# Patient Record
Sex: Male | Born: 1960 | Race: White | Hispanic: No | Marital: Single | Smoking: Current every day smoker
Health system: Southern US, Community
[De-identification: ages and names within clinical notes are randomized; demographics above are authoritative.]

## PROBLEM LIST (undated history)

## (undated) DIAGNOSIS — M1990 Unspecified osteoarthritis, unspecified site: Secondary | ICD-10-CM

## (undated) DIAGNOSIS — Z8619 Personal history of other infectious and parasitic diseases: Secondary | ICD-10-CM

## (undated) DIAGNOSIS — I10 Essential (primary) hypertension: Principal | ICD-10-CM

## (undated) DIAGNOSIS — Z87898 Personal history of other specified conditions: Secondary | ICD-10-CM

## (undated) DIAGNOSIS — A630 Anogenital (venereal) warts: Secondary | ICD-10-CM

## (undated) HISTORY — PX: ANKLE FRACTURE SURGERY: SHX122

## (undated) HISTORY — DX: Personal history of other specified conditions: Z87.898

## (undated) HISTORY — DX: Anogenital (venereal) warts: A63.0

## (undated) HISTORY — PX: OTHER SURGICAL HISTORY: SHX169

## (undated) HISTORY — DX: Unspecified osteoarthritis, unspecified site: M19.90

## (undated) HISTORY — DX: Personal history of other infectious and parasitic diseases: Z86.19

## (undated) HISTORY — PX: SPINE SURGERY: SHX786

## (undated) HISTORY — DX: Essential (primary) hypertension: I10

---

## 2002-11-15 ENCOUNTER — Encounter: Payer: Self-pay | Admitting: Orthopaedic Surgery

## 2002-11-15 ENCOUNTER — Inpatient Hospital Stay (HOSPITAL_COMMUNITY): Admission: EM | Admit: 2002-11-15 | Discharge: 2002-11-17 | Payer: Self-pay | Admitting: Emergency Medicine

## 2002-11-15 ENCOUNTER — Encounter: Payer: Self-pay | Admitting: Emergency Medicine

## 2004-03-16 ENCOUNTER — Inpatient Hospital Stay (HOSPITAL_BASED_OUTPATIENT_CLINIC_OR_DEPARTMENT_OTHER): Admission: RE | Admit: 2004-03-16 | Discharge: 2004-03-16 | Payer: Self-pay | Admitting: Cardiovascular Disease

## 2007-07-27 ENCOUNTER — Emergency Department (HOSPITAL_COMMUNITY): Admission: EM | Admit: 2007-07-27 | Discharge: 2007-07-27 | Payer: Self-pay | Admitting: Family Medicine

## 2010-03-30 ENCOUNTER — Ambulatory Visit: Payer: Self-pay | Admitting: Cardiovascular Disease

## 2010-12-16 NOTE — Op Note (Signed)
NAME:  Edward Bishop, Edward Bishop                     ACCOUNT NO.:  [id number]   MEDICAL RECORD NO.:  [id number]                   PATIENT TYPE:  EMS   LOCATION:  MINO                                 FACILITY:   PHYSICIAN:  Claude Manges. Cleophas Dunker, M.D.            DATE OF BIRTH:  [birth year]   DATE OF PROCEDURE:  11/15/2002  DATE OF DISCHARGE:                                 OPERATIVE REPORT   PREOPERATIVE DIAGNOSIS:  Displaced bimalleolar portion of trimalleolar  fracture, left ankle.   POSTOPERATIVE DIAGNOSIS:  Displaced bimalleolar portion of trimalleolar  fracture, left ankle.   OPERATION/PROCEDURE:  Open reduction/internal fixation, bimalleolar  fracture, left ankle.   SURGEON:  Claude Manges. Cleophas Dunker, M.D.   ASSISTANT:  Legrand Pitts. Duffy, P.A.   ANESTHESIA:  General orotracheal.   COMPLICATIONS:  None.   PROCEDURE:  With the patient comfortable on the operating table and under  general orotracheal anesthesia, a bump is placed beneath the left hip to  facilitate exposure of the lateral left ankle.  The left ankle was prepped  with Betadine scrub and then DuraPrep from the tips of the toes to the  knees.  Sterile draping was performed.  With the extremity still elevated,  it was Esmarch exsanguinated with a proximal tourniquet to 250 mmHg.  The  fracture involved the medial malleolus and the lateral malleolus at the  level of the plafond and the joint.  There was diastasis.  There was a very  small posterior tibial lip fracture that was nondisplaced and therefore did  not require internal fixation.   Initial procedure was performed medially.  An oblique incision was made over  the medial malleolus via blunt dissection.  The soft tissue was then  elevated off the fracture site.  Hematoma was evacuated.  Under direct  visualization the fracture was reduced. There was invagination of periosteum  and this was just elevated so that we could reduce the fracture and was  maintained with a  K-wire.  Drill hole was then placed in the tip of the  medial malleolus into the more proximal fragment and a single 55-mm fully  threaded cancellous screw was then inserted with excellent fixation.  It was  nice and tight and no motion whatsoever at the fracture site.  A K-wire was  reduced and again with a range of motion the ankle was not unstable.  The  wound was irrigated with saline solution, the periosteum closed anatomically  with 2-0 Vicryl, the subcu with 2-0 Vicryl and the skin closed with skin  clips.  We did check position with image intensification and felt the screw  was in excellent position.   A long lateral incision was then made over the lateral malleolus and via  sharp dissection carried into the subcutaneous tissue.  The fibula was  located above the peroneal tendons.  A small incision was then made as there  was no fracture rupture of the periosteum.  Then  this was peeled with a  Glorious Peach off the distal fibula.  The fibula was a complex fracture extending  over an area of about 3 inches.  Under direct visualization the fracture was  maintained with bone clamps.  A transfixion screw was placed from proximal  anterior to distal posterior with good fixation of the larger of the  fragments.  A 7-hole DePuy plate was then applied and secured with 3  proximal cortical screws maintaining close apposition of the plate to the  fibula and 2 screws were inserted distally.  The very distal screw was used  as a diastasis screw with the ankle in maximum dorsiflexion.  A drill hole  was then made across the fibula.  A 55 mm cortical screw was then inserted  and cinched with excellent reduction of the diastasis and good purchase of  bone.  We checked under image intensification and felt we had a very stable  construct.  Clinically it was stable.  Wound was then irrigated with saline  solution.  Periosteum and fascia closed with 2-0 Vicryl, subcu with the same  material.  Skin closed  with skin clips.  Sterile bulky dressing was applied  followed by posterior splint and coaptation splint with 5 x 30 plaster  splint.  The tourniquet was deflated with immediate capillary refill to the  toes.   The patient tolerated the procedure without complications.                                                Claude Manges. Cleophas Dunker, M.D.    PWW/MEDQ  D:  11/15/2002  T:  11/16/2002  Job:  [id number]

## 2010-12-16 NOTE — H&P (Signed)
NAME:  Edward Bishop, Edward Bishop                     ACCOUNT NO.:  [id number]   MEDICAL RECORD NO.:  [id number]                   PATIENT TYPE:  AMB   LOCATION:                                       FACILITY:  [facility]   PHYSICIAN:  Vesta Mixer, M.D.              DATE OF BIRTH:  1961/06/16   DATE OF ADMISSION:  03/16/2004  DATE OF DISCHARGE:                                HISTORY & PHYSICAL   Edward Bishop is a young gentleman with history of supraventricular tachycardia.  He has a long history of cigarette smoking.  We performed a stress  Cardiolite study for some evaluation of some episodes of chest pain and it  was found to be abnormal.  He was found to have attenuation of the anterior  leads with possibility of anterior lateral ischemia. Because of these  abnormalities, he is referred for heart catheterization.   The patient has had intermittent episodes of chest tightness and chest  fullness over the past several months.  These typically occur more with  exertion.  He has not had pain at rest.  He denies any syncope or  presyncope.  He denies any shortness of breath, cough, fever or sputum  production.   CURRENT MEDICATIONS:  1. Lanoxin 0.25 mg daily.  2. Atenolol 50 mg daily.   He is allergic to PENICILLIN.   PAST MEDICAL HISTORY:  History of supraventricular tachycardia.  He is well  controlled on Lanoxin and atenolol.   SOCIAL HISTORY:  The patient smokes approximately one pack a day.  He works  as a Freight forwarder for [employer].   His review of systems was reviewed and is essentially negative.   His family history is positive for coronary artery disease.   PHYSICAL EXAMINATION:  GENERAL:  He is a young gentleman in no acute  distress. He is alert and oriented x3 and his mood and affect are normal.  VITAL SIGNS:  His weight is 188, his blood pressure is 128/88 with heart  rate of 60.  HEENT:  Reveals 2+ carotids.  He has no bruits.  No jugular venous  distention.  No  thyromegaly.  LUNGS:  Clear to auscultation.  HEART:  Regular rate.  S1, S2 with no murmurs, gallops or rub.  ABDOMEN:  Reveals good bowel sounds and is nontender.  EXTREMITIES:  He has no clubbing, cyanosis or edema.  NEURO:  Nonfocal.   [name] presents with some episodes of chest pain.  He had a stress  Cardiolite study which suggests that he may have some anterior lateral  ischemia.  I have suggested that we proceed with heart catheterization.  We  have discussed the risks, benefits and options of heart catheterization. He  understands and agrees to proceed.  Vesta Mixer, M.D.    PJN/MEDQ  D:  03/02/2004  T:  03/02/2004  Job:  [id number]

## 2010-12-16 NOTE — Cardiovascular Report (Signed)
NAME:  Edward Bishop, Edward Bishop                     ACCOUNT NO.:  [id number]   MEDICAL RECORD NO.:  [id number]                   PATIENT TYPE:  OIB   LOCATION:  6501                                 FACILITY:  [facility]   PHYSICIAN:  Vesta Mixer, M.D.              DATE OF BIRTH:  20-Jul-1961   DATE OF PROCEDURE:  03/16/2004  DATE OF DISCHARGE:  03/16/2004                              CARDIAC CATHETERIZATION   PROCEDURE:  Left heart catheterization with coronary angiography.   The right femoral artery was easily cannulated using the modified Seldinger  technique.   HEMODYNAMICS:  The left ventricular pressure was 107/13 with an aortic  pressure of 106/69.   CORONARY ANGIOGRAPHY:  1. Left main coronary artery is relatively smooth and normal.  2. The left anterior descending artery is smooth and normal.  There are     several moderate size diagonal branches which are normal.  3. The left circumflex artery is smooth and normal.  There are several     marginal branches which are normal.  4. The right coronary artery is large and dominant.  It is smooth and normal     throughout its course.  It gives off a moderate size posterior descending     artery and then a moderate size posterior lateral branch.  These branches     are smooth and normal.   LEFT VENTRICULOGRAM:  Left ventriculogram was performed in a 30-RAO  position.  It reveals overall normal left ventricular systolic function.  The ejection fraction is approximately 55%.  There is no mitral  regurgitation.   COMPLICATIONS:  None.   CONCLUSIONS:  1. Smooth and normal coronary arteries.  2. Normal left ventricular systolic function.                                               Vesta Mixer, M.D.    PJN/MEDQ  D:  03/16/2004  T:  03/17/2004  Job:  [id number]

## 2011-02-09 ENCOUNTER — Other Ambulatory Visit: Payer: Self-pay | Admitting: Occupational Medicine

## 2011-02-09 ENCOUNTER — Ambulatory Visit: Payer: Self-pay

## 2011-02-09 DIAGNOSIS — M549 Dorsalgia, unspecified: Secondary | ICD-10-CM

## 2011-07-11 ENCOUNTER — Telehealth: Payer: Self-pay

## 2011-07-11 NOTE — Telephone Encounter (Signed)
Opp,H&P,Cath faxed to [facility]  @ [phone]  07/11/11/km

## 2011-11-13 ENCOUNTER — Encounter: Payer: Self-pay | Admitting: *Deleted

## 2011-11-13 ENCOUNTER — Ambulatory Visit (INDEPENDENT_AMBULATORY_CARE_PROVIDER_SITE_OTHER): Payer: Self-pay | Admitting: Cardiovascular Disease

## 2011-11-13 ENCOUNTER — Encounter: Payer: Self-pay | Admitting: Cardiovascular Disease

## 2011-11-13 VITALS — BP 130/87 | HR 72 | Ht 72.0 in | Wt 176.1 lb

## 2011-11-13 DIAGNOSIS — E785 Hyperlipidemia, unspecified: Secondary | ICD-10-CM

## 2011-11-13 DIAGNOSIS — R002 Palpitations: Secondary | ICD-10-CM

## 2011-11-13 DIAGNOSIS — I498 Other specified cardiac arrhythmias: Secondary | ICD-10-CM

## 2011-11-13 DIAGNOSIS — I471 Supraventricular tachycardia: Secondary | ICD-10-CM | POA: Insufficient documentation

## 2011-11-13 LAB — HEPATIC FUNCTION PANEL
ALT: 16 U/L (ref 0–53)
AST: 19 U/L (ref 0–37)
Albumin: 4.8 g/dL (ref 3.5–5.2)
Alkaline Phosphatase: 71 U/L (ref 39–117)
Bilirubin, Direct: 0.1 mg/dL (ref 0.0–0.3)
Total Bilirubin: 0.9 mg/dL (ref 0.3–1.2)
Total Protein: 7.6 g/dL (ref 6.0–8.3)

## 2011-11-13 LAB — BASIC METABOLIC PANEL
BUN: 11 mg/dL (ref 6–23)
CO2: 24 mEq/L (ref 19–32)
Calcium: 9.8 mg/dL (ref 8.4–10.5)
Chloride: 103 mEq/L (ref 96–112)
Creatinine, Ser: 1.1 mg/dL (ref 0.4–1.5)
GFR: 73.88 mL/min (ref >60.00)
Glucose, Bld: 93 mg/dL (ref 70–99)
Potassium: 4.6 mEq/L (ref 3.5–5.1)
Sodium: 139 mEq/L (ref 135–145)

## 2011-11-13 LAB — LIPID PANEL
Cholesterol: 191 mg/dL (ref 0–200)
HDL: 56.7 mg/dL (ref >39.00)
LDL Cholesterol: 106 mg/dL — ABNORMAL HIGH (ref 0–99)
Total CHOL/HDL Ratio: 3
Triglycerides: 140 mg/dL (ref 0.0–149.0)
VLDL: 28 mg/dL (ref 0.0–40.0)

## 2011-11-13 MED ORDER — PROPRANOLOL HCL 10 MG PO TABS: 10.0000 mg | ORAL_TABLET | Freq: Four times a day (QID) | ORAL | Status: DC | PRN

## 2011-11-13 NOTE — Patient Instructions (Signed)
Your physician wants you to follow-up in: 1 year You will receive a reminder letter in the mail two months in advance. If you don't receive a letter, please call our office to schedule the follow-up appointment.  Your physician recommends that you return for a FASTING lipid profile: today and in a year. 

## 2011-11-13 NOTE — Assessment & Plan Note (Signed)
Lincon has a history of supraventricular tachycardia. He's been very stable the past several years and has not had to go to the emergency room in quite a long time. He has been off his daily beta blocker without any problems.  We will call in propanolol that he can take on an as-needed basis.  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 Because he's been quite stable.  it is my opinion that he does not need to have a DOT physical on an annual basis. I think that he can revert back to the every other year schedule that is more typical.
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
## 2011-11-13 NOTE — Assessment & Plan Note (Signed)
He has a history of hyperlipidemia the past. We'll check fasting laboratory.

## 2013-05-05 ENCOUNTER — Ambulatory Visit (INDEPENDENT_AMBULATORY_CARE_PROVIDER_SITE_OTHER): Payer: BC Managed Care – PPO | Admitting: Internal Medicine

## 2013-05-05 ENCOUNTER — Encounter: Payer: Self-pay | Admitting: Internal Medicine

## 2013-05-05 ENCOUNTER — Other Ambulatory Visit (INDEPENDENT_AMBULATORY_CARE_PROVIDER_SITE_OTHER): Payer: BC Managed Care – PPO

## 2013-05-05 VITALS — BP 118/86 | HR 82 | Temp 98.3°F | Resp 16 | Ht 72.0 in | Wt 198.0 lb

## 2013-05-05 DIAGNOSIS — I498 Other specified cardiac arrhythmias: Secondary | ICD-10-CM

## 2013-05-05 DIAGNOSIS — A630 Anogenital (venereal) warts: Secondary | ICD-10-CM

## 2013-05-05 DIAGNOSIS — Z0000 Encounter for general adult medical examination without abnormal findings: Secondary | ICD-10-CM

## 2013-05-05 DIAGNOSIS — F17200 Nicotine dependence, unspecified, uncomplicated: Secondary | ICD-10-CM

## 2013-05-05 DIAGNOSIS — I471 Supraventricular tachycardia: Secondary | ICD-10-CM

## 2013-05-05 DIAGNOSIS — Z720 Tobacco use: Secondary | ICD-10-CM

## 2013-05-05 DIAGNOSIS — E785 Hyperlipidemia, unspecified: Secondary | ICD-10-CM

## 2013-05-05 DIAGNOSIS — Z23 Encounter for immunization: Secondary | ICD-10-CM

## 2013-05-05 LAB — CBC WITH DIFFERENTIAL/PLATELET
Basophils Absolute: 0 10*3/uL (ref 0.0–0.1)
Basophils Relative: 0.3 % (ref 0.0–3.0)
Eosinophils Absolute: 0.2 10*3/uL (ref 0.0–0.7)
Eosinophils Relative: 1.6 % (ref 0.0–5.0)
HCT: 45.8 % (ref 39.0–52.0)
Hemoglobin: 15.6 g/dL (ref 13.0–17.0)
Lymphocytes Relative: 16.8 % (ref 12.0–46.0)
Lymphs Abs: 2.2 10*3/uL (ref 0.7–4.0)
MCHC: 34.2 g/dL (ref 30.0–36.0)
MCV: 96.5 fl (ref 78.0–100.0)
Monocytes Absolute: 0.7 10*3/uL (ref 0.1–1.0)
Monocytes Relative: 5.7 % (ref 3.0–12.0)
Neutro Abs: 9.7 10*3/uL — ABNORMAL HIGH (ref 1.4–7.7)
Neutrophils Relative %: 75.6 % (ref 43.0–77.0)
Platelets: 204 10*3/uL (ref 150.0–400.0)
RBC: 4.75 Mil/uL (ref 4.22–5.81)
RDW: 13.3 % (ref 11.5–14.6)
WBC: 12.9 10*3/uL — ABNORMAL HIGH (ref 4.5–10.5)

## 2013-05-05 LAB — COMPREHENSIVE METABOLIC PANEL
ALT: 25 U/L (ref 0–53)
AST: 22 U/L (ref 0–37)
Albumin: 4.3 g/dL (ref 3.5–5.2)
Alkaline Phosphatase: 64 U/L (ref 39–117)
BUN: 8 mg/dL (ref 6–23)
CO2: 25 mEq/L (ref 19–32)
Calcium: 9.2 mg/dL (ref 8.4–10.5)
Chloride: 104 mEq/L (ref 96–112)
Creatinine, Ser: 1.1 mg/dL (ref 0.4–1.5)
GFR: 79.12 mL/min (ref >60.00)
Glucose, Bld: 99 mg/dL (ref 70–99)
Potassium: 3.8 mEq/L (ref 3.5–5.1)
Sodium: 136 mEq/L (ref 135–145)
Total Bilirubin: 0.8 mg/dL (ref 0.3–1.2)
Total Protein: 6.9 g/dL (ref 6.0–8.3)

## 2013-05-05 LAB — URINALYSIS, ROUTINE W REFLEX MICROSCOPIC
Bilirubin Urine: NEGATIVE
Hgb urine dipstick: NEGATIVE
Ketones, ur: NEGATIVE
Nitrite: NEGATIVE
RBC / HPF: NONE SEEN (ref 0–?)
Specific Gravity, Urine: 1.01 (ref 1.000–1.030)
Total Protein, Urine: NEGATIVE
Urine Glucose: NEGATIVE
Urobilinogen, UA: 0.2 (ref 0.0–1.0)
pH: 6 (ref 5.0–8.0)

## 2013-05-05 LAB — LIPID PANEL
Cholesterol: 161 mg/dL (ref 0–200)
HDL: 47.7 mg/dL (ref >39.00)
LDL Cholesterol: 94 mg/dL (ref 0–99)
Total CHOL/HDL Ratio: 3
Triglycerides: 97 mg/dL (ref 0.0–149.0)
VLDL: 19.4 mg/dL (ref 0.0–40.0)

## 2013-05-05 LAB — HIV ANTIBODY (ROUTINE TESTING W REFLEX): HIV: NONREACTIVE

## 2013-05-05 LAB — PSA: PSA: 0.56 ng/mL (ref 0.10–4.00)

## 2013-05-05 LAB — HEPATITIS C ANTIBODY: HCV Ab: NEGATIVE

## 2013-05-05 LAB — RPR

## 2013-05-05 LAB — TSH: TSH: 0.88 u[IU]/mL (ref 0.35–5.50)

## 2013-05-05 MED ORDER — IMIQUIMOD 5 % EX CREA: TOPICAL_CREAM | CUTANEOUS | Status: DC

## 2013-05-05 NOTE — Progress Notes (Signed)
Subjective:    Patient ID: Edward Bishop, male    DOB: 07/28/1961, 52 y.o.   MRN: [id number]  HPI  New to me for a complete physical but he also complains of bumps at the base of his penis, he has a history or genital warts. He wants to be treated for warts and to be tested for STD's as well.  Review of Systems  Constitutional: Negative.  Negative for fever, chills, diaphoresis, appetite change and fatigue.  HENT: Negative.   Eyes: Negative.   Respiratory: Negative.  Negative for cough, chest tightness, shortness of breath, wheezing and stridor.   Cardiovascular: Negative.  Negative for chest pain, palpitations and leg swelling.  Gastrointestinal: Negative.  Negative for nausea, vomiting, abdominal pain, diarrhea and constipation.  Endocrine: Negative.   Genitourinary: Negative.  Negative for dysuria, urgency, frequency, hematuria, flank pain, decreased urine volume, discharge, penile swelling, scrotal swelling, enuresis, difficulty urinating, genital sores, penile pain and testicular pain.  Musculoskeletal: Negative.   Skin: Positive for rash. Negative for color change and pallor.  Allergic/Immunologic: Negative.   Neurological: Negative.   Hematological: Negative.  Negative for adenopathy. Does not bruise/bleed easily.  Psychiatric/Behavioral: Negative.        Objective:   Physical Exam  Vitals reviewed. Constitutional: He is oriented to person, place, and time. He appears well-developed and well-nourished. No distress.  HENT:  Head: Normocephalic and atraumatic.  Mouth/Throat: Oropharynx is clear and moist. No oropharyngeal exudate.  Eyes: Conjunctivae are normal. Right eye exhibits no discharge. Left eye exhibits no discharge. No scleral icterus.  Neck: Normal range of motion. Neck supple. No JVD present. No tracheal deviation present. No thyromegaly present.  Cardiovascular: Normal rate, regular rhythm, normal heart sounds and intact distal pulses.  Exam reveals no gallop  and no friction rub.   No murmur heard. Pulmonary/Chest: Effort normal and breath sounds normal. No stridor. No respiratory distress. He has no wheezes. He has no rales. He exhibits no tenderness.  Abdominal: Soft. Bowel sounds are normal. He exhibits no distension and no mass. There is no tenderness. There is no rebound and no guarding. Hernia confirmed negative in the right inguinal area and confirmed negative in the left inguinal area.  Genitourinary: Rectum normal, prostate normal, testes normal and penis normal. Rectal exam shows no external hemorrhoid, no internal hemorrhoid, no fissure, no mass, no tenderness and anal tone normal. Guaiac negative stool.    Prostate is not enlarged and not tender. Right testis shows no mass, no swelling and no tenderness. Right testis is descended. Left testis shows no mass, no swelling and no tenderness. Left testis is descended. Circumcised. No penile erythema or penile tenderness. No discharge found.  Musculoskeletal: Normal range of motion. He exhibits no edema and no tenderness.  Lymphadenopathy:    He has no cervical adenopathy.       Right: No inguinal adenopathy present.       Left: No inguinal adenopathy present.  Neurological: He is oriented to person, place, and time.  Skin: Skin is warm and dry. No rash noted. He is not diaphoretic. No erythema. No pallor.  Psychiatric: He has a normal mood and affect. His behavior is normal. Judgment and thought content normal.    Lab Results  Component Value Date   GLUCOSE 93 11/13/2011   CHOL 191 11/13/2011   TRIG 140.0 11/13/2011   HDL 56.70 11/13/2011   LDLCALC 106* 11/13/2011   ALT 16 11/13/2011   AST 19 11/13/2011   NA 139 11/13/2011  K 4.6 11/13/2011   CL 103 11/13/2011   CREATININE 1.1 11/13/2011   BUN 11 11/13/2011   CO2 24 11/13/2011        Assessment & Plan:

## 2013-05-05 NOTE — Patient Instructions (Addendum)
Health Maintenance, Males A healthy lifestyle and preventative care can promote health and wellness.  Maintain regular health, dental, and eye exams.  Eat a healthy diet. Foods like vegetables, fruits, whole grains, low-fat dairy products, and lean protein foods contain the nutrients you need without too many calories. Decrease your intake of foods high in solid fats, added sugars, and salt. Get information about a proper diet from your caregiver, if necessary.  Regular physical exercise is one of the most important things you can do for your health. Most adults should get at least 150 minutes of moderate-intensity exercise (any activity that increases your heart rate and causes you to sweat) each week. In addition, most adults need muscle-strengthening exercises on 2 or more days a week.   Maintain a healthy weight. The body mass index (BMI) is a screening tool to identify possible weight problems. It provides an estimate of body fat based on height and weight. Your caregiver can help determine your BMI, and can help you achieve or maintain a healthy weight. For adults 20 years and older:  A BMI below 18.5 is considered underweight.  A BMI of 18.5 to 24.9 is normal.  A BMI of 25 to 29.9 is considered overweight.  A BMI of 30 and above is considered obese.  Maintain normal blood lipids and cholesterol by exercising and minimizing your intake of saturated fat. Eat a balanced diet with plenty of fruits and vegetables. Blood tests for lipids and cholesterol should begin at age 20 and be repeated every 5 years. If your lipid or cholesterol levels are high, you are over 50, or you are a high risk for heart disease, you may need your cholesterol levels checked more frequently.Ongoing high lipid and cholesterol levels should be treated with medicines, if diet and exercise are not effective.  If you smoke, find out from your caregiver how to quit. If you do not use tobacco, do not start.  If you  choose to drink alcohol, do not exceed 2 drinks per day. One drink is considered to be 12 ounces (355 mL) of beer, 5 ounces (148 mL) of wine, or 1.5 ounces (44 mL) of liquor.  Avoid use of street drugs. Do not share needles with anyone. Ask for help if you need support or instructions about stopping the use of drugs.  High blood pressure causes heart disease and increases the risk of stroke. Blood pressure should be checked at least every 1 to 2 years. Ongoing high blood pressure should be treated with medicines if weight loss and exercise are not effective.  If you are 45 to 51 years old, ask your caregiver if you should take aspirin to prevent heart disease.  Diabetes screening involves taking a blood sample to check your fasting blood sugar level. This should be done once every 3 years, after age 45, if you are within normal weight and without risk factors for diabetes. Testing should be considered at a younger age or be carried out more frequently if you are overweight and have at least 1 risk factor for diabetes.  Colorectal cancer can be detected and often prevented. Most routine colorectal cancer screening begins at the age of 50 and continues through age 75. However, your caregiver may recommend screening at an earlier age if you have risk factors for colon cancer. On a yearly basis, your caregiver may provide home test kits to check for hidden blood in the stool. Use of a small camera at the end of a tube,   to directly examine the colon (sigmoidoscopy or colonoscopy), can detect the earliest forms of colorectal cancer. Talk to your caregiver about this at age 2, when routine screening begins. Direct examination of the colon should be repeated every 5 to 10 years through age 57, unless early forms of pre-cancerous polyps or small growths are found.  Hepatitis C blood testing is recommended for all people born from [birth year] through 1964 and any individual with known risks for hepatitis C.  Healthy  men should no longer receive prostate-specific antigen (PSA) blood tests as part of routine cancer screening. Consult with your caregiver about prostate cancer screening.  Testicular cancer screening is not recommended for adolescents or adult males who have no symptoms. Screening includes self-exam, caregiver exam, and other screening tests. Consult with your caregiver about any symptoms you have or any concerns you have about testicular cancer.  Practice safe sex. Use condoms and avoid high-risk sexual practices to reduce the spread of sexually transmitted infections (STIs).  Use sunscreen with a sun protection factor (SPF) of 30 or greater. Apply sunscreen liberally and repeatedly throughout the day. You should seek shade when your shadow is shorter than you. Protect yourself by wearing long sleeves, pants, a wide-brimmed hat, and sunglasses year round, whenever you are outdoors.  Notify your caregiver of new moles or changes in moles, especially if there is a change in shape or color. Also notify your caregiver if a mole is larger than the size of a pencil eraser.  A one-time screening for abdominal aortic aneurysm (AAA) and surgical repair of large AAAs by sound wave imaging (ultrasonography) is recommended for ages 24 to 39 years who are current or former smokers.  Stay current with your immunizations. Document Released: 01/13/2008 Document Revised: 10/09/2011 Document Reviewed: 12/12/2010 [facility] Patient Information 2014 [place], [place]. Safe Sex Safe sex is about reducing the risk of giving or getting a sexually transmitted disease (STD). STDs are spread through sexual contact involving the genitals, mouth, or rectum. Some STDS can be cured and others cannot. Safe sex can also prevent unintended pregnancies.  SAFE SEX PRACTICES  Limit your sexual activity to only one partner who is only having sex with you.  Talk to your partner about their past partners, past STDs, and drug use.  Use  a condom every time you have sexual intercourse. This includes vaginal, oral, and anal sexual activity. Both females and males should wear condoms during oral sex. Only use latex or polyurethane condoms and water-based lubricants. Petroleum-based lubricants or oils used to lubricate a condom will weaken the condom and increase the chance that it will break. The condom should be in place from the beginning to the end of sexual activity. Wearing a condom reduces, but does not completely eliminate, your risk of getting or giving a STD. STDs can be spread by contact with skin of surrounding areas.  Get vaccinated for hepatitis B and HPV.  Avoid alcohol and recreational drugs which can affect your judgement. You may forget to use a condom or participate in high-risk sex.  For females, avoid douching after sexual intercourse. Douching can spread an infection farther into the reproductive tract.  Check your body for signs of sores, blisters, rashes, or unusual discharge. See your caregiver if you notice any of these signs.  Avoid sexual contact if you have symptoms of an infection or are being treated for an STD. If you or your partner has herpes, avoid sexual contact when blisters are present. Use condoms at all  other times.  See your caregiver for regular screenings, examinations, and tests for STDs. Before having sex with a new partner, each of you should be screened for STDs and talk about the results with your partner. BENEFITS OF SAFE SEX   There is less of a chance of getting or giving an STD.  You can prevent unwanted or unintended pregnancies.  By discussing safer sex concerns with your partner, you may increase feelings of intimacy, comfort, trust, and honesty between the both of you. Document Released: 08/24/2004 Document Revised: 04/10/2012 Document Reviewed: 01/08/2012 [facility] Patient Information 2014 [place], [place].

## 2013-05-05 NOTE — Assessment & Plan Note (Signed)
Will treat with aldara Labs to screen for STD's were ordered

## 2013-05-05 NOTE — Assessment & Plan Note (Signed)
FLP today 

## 2013-05-05 NOTE — Assessment & Plan Note (Addendum)
He has not had any palpitations and has decided not to take a beta-blocker

## 2013-05-05 NOTE — Assessment & Plan Note (Signed)
He is not ready to quit smoking 

## 2013-05-05 NOTE — Assessment & Plan Note (Signed)
Exam done He was referred for a colonoscopy Vaccines were updated Labs ordered Pt ed material was given

## 2013-05-06 LAB — HSV 2 ANTIBODY, IGG: HSV 2 Glycoprotein G Ab, IgG: <0.1 IV

## 2013-05-06 LAB — GC/CHLAMYDIA PROBE AMP, URINE
Chlamydia, Swab/Urine, PCR: NEGATIVE
GC Probe Amp, Urine: NEGATIVE

## 2013-05-09 ENCOUNTER — Encounter: Payer: Self-pay | Admitting: Internal Medicine

## 2013-05-20 LAB — HM COLONOSCOPY

## 2013-06-19 ENCOUNTER — Encounter: Payer: Self-pay | Admitting: Internal Medicine

## 2013-07-07 ENCOUNTER — Telehealth: Payer: Self-pay

## 2013-07-07 ENCOUNTER — Other Ambulatory Visit: Payer: Self-pay | Admitting: Internal Medicine

## 2013-07-07 ENCOUNTER — Other Ambulatory Visit (INDEPENDENT_AMBULATORY_CARE_PROVIDER_SITE_OTHER): Payer: BC Managed Care – PPO

## 2013-07-07 DIAGNOSIS — D72829 Elevated white blood cell count, unspecified: Secondary | ICD-10-CM

## 2013-07-07 LAB — CBC WITH DIFFERENTIAL/PLATELET
Basophils Absolute: 0 10*3/uL (ref 0.0–0.1)
Basophils Relative: 0.3 % (ref 0.0–3.0)
Eosinophils Absolute: 0.2 10*3/uL (ref 0.0–0.7)
Eosinophils Relative: 2.7 % (ref 0.0–5.0)
HCT: 44 % (ref 39.0–52.0)
Hemoglobin: 15 g/dL (ref 13.0–17.0)
Lymphocytes Relative: 28.1 % (ref 12.0–46.0)
Lymphs Abs: 2.6 10*3/uL (ref 0.7–4.0)
MCHC: 34.1 g/dL (ref 30.0–36.0)
MCV: 96 fl (ref 78.0–100.0)
Monocytes Absolute: 0.7 10*3/uL (ref 0.1–1.0)
Monocytes Relative: 7.1 % (ref 3.0–12.0)
Neutro Abs: 5.7 10*3/uL (ref 1.4–7.7)
Neutrophils Relative %: 61.8 % (ref 43.0–77.0)
Platelets: 211 10*3/uL (ref 150.0–400.0)
RBC: 4.59 Mil/uL (ref 4.22–5.81)
RDW: 13.5 % (ref 11.5–14.6)
WBC: 9.3 10*3/uL (ref 4.5–10.5)

## 2013-07-07 NOTE — Telephone Encounter (Signed)
[place] with [facility] called stating that pt is there in the lab and was advised on 05/09/13 e-mail to come back and have CBC rechecked. Please advise if ok to place orders, pt has another appt in 15 min at another office. Thanks

## 2013-07-07 NOTE — Telephone Encounter (Signed)
Repeat labs ordered

## 2013-07-08 ENCOUNTER — Encounter: Payer: Self-pay | Admitting: Internal Medicine

## 2013-07-09 LAB — SPEP & IFE WITH QIG
Albumin ELP: 63.6 % (ref 55.8–66.1)
Alpha-1-Globulin: 6.4 % — ABNORMAL HIGH (ref 2.9–4.9)
Alpha-2-Globulin: 9.5 % (ref 7.1–11.8)
Beta 2: 4 % (ref 3.2–6.5)
Beta Globulin: 6.8 % (ref 4.7–7.2)
Gamma Globulin: 9.7 % — ABNORMAL LOW (ref 11.1–18.8)
IgA: 200 mg/dL (ref 68–379)
IgG (Immunoglobin G), Serum: 702 mg/dL (ref 650–1600)
IgM, Serum: 48 mg/dL (ref 41–251)
Total Protein, Serum Electrophoresis: 6.6 g/dL (ref 6.0–8.3)

## 2014-05-11 ENCOUNTER — Telehealth: Payer: Self-pay | Admitting: Cardiovascular Disease

## 2014-05-11 NOTE — Telephone Encounter (Signed)
The pt is advised and he verbalized understanding. 

## 2014-05-11 NOTE — Telephone Encounter (Signed)
I will see him on the scheduled office visit. I'm not sure that he needs a stress test

## 2014-05-11 NOTE — Telephone Encounter (Signed)
The pt states that at his last OV with Dr Acie Fredrickson on 11/13/11 he was advised that he would need to have a stress test in the future.  He wants to know if he still needs to have a stress test and if so can he have it done before his f/u with Dr Acie Fredrickson which is scheduled on 06/12/14. Please advise.

## 2014-05-11 NOTE — Telephone Encounter (Signed)
New message          Pt is requesting stress test before f/u visit with nahser

## 2014-05-14 ENCOUNTER — Encounter: Payer: Self-pay | Admitting: Internal Medicine

## 2014-05-14 ENCOUNTER — Ambulatory Visit (INDEPENDENT_AMBULATORY_CARE_PROVIDER_SITE_OTHER): Payer: BC Managed Care – PPO | Admitting: Internal Medicine

## 2014-05-14 VITALS — BP 136/78 | HR 80 | Temp 98.1°F | Resp 16 | Ht 72.0 in | Wt 195.0 lb

## 2014-05-14 DIAGNOSIS — J320 Chronic maxillary sinusitis: Secondary | ICD-10-CM | POA: Insufficient documentation

## 2014-05-14 DIAGNOSIS — Z23 Encounter for immunization: Secondary | ICD-10-CM

## 2014-05-14 DIAGNOSIS — J301 Allergic rhinitis due to pollen: Secondary | ICD-10-CM | POA: Insufficient documentation

## 2014-05-14 DIAGNOSIS — R040 Epistaxis: Secondary | ICD-10-CM | POA: Insufficient documentation

## 2014-05-14 MED ORDER — MOXIFLOXACIN HCL 400 MG PO TABS: 400.0000 mg | ORAL_TABLET | Freq: Every day | ORAL | Status: AC

## 2014-05-14 MED ORDER — MOMETASONE FUROATE 50 MCG/ACT NA SUSP: 4.0000 | Freq: Every day | NASAL | Status: DC

## 2014-05-14 MED ORDER — METHYLPREDNISOLONE ACETATE 80 MG/ML IJ SUSP
120.0000 mg | Freq: Once | INTRAMUSCULAR | Status: AC
  Administered 2014-05-14: 120 mg via INTRAMUSCULAR

## 2014-05-14 MED ORDER — CETIRIZINE-PSEUDOEPHEDRINE ER 5-120 MG PO TB12: 1.0000 | ORAL_TABLET | Freq: Two times a day (BID) | ORAL | Status: DC

## 2014-05-14 NOTE — Addendum Note (Signed)
Addended by: Estell Harpin T on: 05/14/2014 02:47 PM   Modules accepted: Orders

## 2014-05-14 NOTE — Assessment & Plan Note (Signed)
Will get a CT scan done to see if there is an obstruction of the OM complex Will treat the infection with avelox Will treat the allergic component with zyrtec-d and nasonex

## 2014-05-14 NOTE — Patient Instructions (Signed)

## 2014-05-14 NOTE — Progress Notes (Signed)
Subjective:    Patient ID: Edward Bishop, male    DOB: Jan 09, 1961, 53 y.o.   MRN: [id number]  Sinusitis This is a recurrent problem. Episode onset: for 3 months. The problem has been gradually worsening since onset. There has been no fever. The fever has been present for less than 1 day. His pain is at a severity of 0/10. He is experiencing no pain. Associated symptoms include congestion, ear pain (right), sinus pressure and sneezing. Pertinent negatives include no chills, coughing, diaphoresis, headaches, hoarse voice, neck pain, shortness of breath, sore throat or swollen glands. Past treatments include antibiotics and spray decongestants (flonase, bactrim, zpak). The treatment provided no relief.      Review of Systems  Constitutional: Negative.  Negative for fever, chills, diaphoresis, activity change, appetite change, fatigue and unexpected weight change.  HENT: Positive for congestion, ear pain (right), nosebleeds, postnasal drip, rhinorrhea, sinus pressure and sneezing. Negative for dental problem, drooling, ear discharge, facial swelling, hearing loss, hoarse voice, mouth sores, sore throat, tinnitus, trouble swallowing and voice change.   Eyes: Negative.   Respiratory: Negative.  Negative for cough and shortness of breath.   Cardiovascular: Negative.  Negative for chest pain, palpitations and leg swelling.  Gastrointestinal: Negative.  Negative for nausea, abdominal pain, diarrhea and constipation.  Endocrine: Negative.   Genitourinary: Negative.   Musculoskeletal: Negative.  Negative for neck pain.  Skin: Negative.  Negative for rash.  Allergic/Immunologic: Positive for environmental allergies. Negative for food allergies and immunocompromised state.  Neurological: Negative.  Negative for headaches.  Hematological: Negative.  Negative for adenopathy. Does not bruise/bleed easily.  Psychiatric/Behavioral: Negative.        Objective:   Physical Exam  Vitals  reviewed. Constitutional: He is oriented to person, place, and time. He appears well-developed and well-nourished.  Non-toxic appearance. He does not have a sickly appearance. He does not appear ill. No distress.  HENT:  Head: Normocephalic and atraumatic.  Right Ear: Hearing, tympanic membrane, external ear and ear canal normal.  Left Ear: Hearing, tympanic membrane, external ear and ear canal normal.  Nose: Mucosal edema and rhinorrhea present. No nose lacerations, sinus tenderness, nasal deformity, septal deviation or nasal septal hematoma. No epistaxis.  No foreign bodies. Right sinus exhibits maxillary sinus tenderness. Right sinus exhibits no frontal sinus tenderness. Left sinus exhibits maxillary sinus tenderness.  Mouth/Throat: Oropharynx is clear and moist and mucous membranes are normal. Mucous membranes are not pale, not dry and not cyanotic. No oral lesions. No trismus in the jaw. No uvula swelling. No oropharyngeal exudate, posterior oropharyngeal edema, posterior oropharyngeal erythema or tonsillar abscesses.  Eyes: Conjunctivae are normal. Right eye exhibits no discharge. Left eye exhibits no discharge. No scleral icterus.  Neck: Normal range of motion. Neck supple. No JVD present. No tracheal deviation present. No thyromegaly present.  Cardiovascular: Normal rate, regular rhythm, normal heart sounds and intact distal pulses.  Exam reveals no gallop and no friction rub.   No murmur heard. Pulmonary/Chest: Effort normal and breath sounds normal. No stridor. No respiratory distress. He has no wheezes. He has no rales. He exhibits no tenderness.  Abdominal: Soft. Bowel sounds are normal. He exhibits no distension and no mass. There is no tenderness. There is no rebound and no guarding.  Musculoskeletal: Normal range of motion. He exhibits no edema and no tenderness.  Lymphadenopathy:    He has no cervical adenopathy.  Neurological: He is oriented to person, place, and time.  Skin: Skin  is warm and dry.  No rash noted. He is not diaphoretic. No erythema. No pallor.     Lab Results  Component Value Date   WBC 9.3 07/07/2013   HGB 15.0 07/07/2013   HCT 44.0 07/07/2013   PLT 211.0 07/07/2013   GLUCOSE 99 05/05/2013   CHOL 161 05/05/2013   TRIG 97.0 05/05/2013   HDL 47.70 05/05/2013   LDLCALC 94 05/05/2013   ALT 25 05/05/2013   AST 22 05/05/2013   NA 136 05/05/2013   K 3.8 05/05/2013   CL 104 05/05/2013   CREATININE 1.1 05/05/2013   BUN 8 05/05/2013   CO2 25 05/05/2013   TSH 0.88 05/05/2013   PSA 0.56 05/05/2013       Assessment & Plan:

## 2014-05-14 NOTE — Assessment & Plan Note (Signed)
He is a smoker so will get a CT scan done to look for mass/obstruction

## 2014-05-14 NOTE — Assessment & Plan Note (Signed)
He is having a flare os symptoms so I gave him an injection of depo-medrol IM Will start zyrtec-d and nasonex

## 2014-05-15 ENCOUNTER — Telehealth: Payer: Self-pay | Admitting: Internal Medicine

## 2014-05-15 NOTE — Telephone Encounter (Signed)
emmi mailed  °

## 2014-05-25 ENCOUNTER — Other Ambulatory Visit: Payer: Self-pay | Admitting: Internal Medicine

## 2014-05-25 DIAGNOSIS — J320 Chronic maxillary sinusitis: Secondary | ICD-10-CM

## 2014-06-01 ENCOUNTER — Ambulatory Visit: Payer: Self-pay | Admitting: Internal Medicine

## 2014-06-12 ENCOUNTER — Ambulatory Visit (INDEPENDENT_AMBULATORY_CARE_PROVIDER_SITE_OTHER): Payer: BC Managed Care – PPO | Admitting: Cardiovascular Disease

## 2014-06-12 ENCOUNTER — Encounter: Payer: Self-pay | Admitting: Cardiovascular Disease

## 2014-06-12 VITALS — BP 128/94 | HR 73 | Ht 72.0 in | Wt 189.0 lb

## 2014-06-12 DIAGNOSIS — I471 Supraventricular tachycardia, unspecified: Secondary | ICD-10-CM

## 2014-06-12 DIAGNOSIS — E785 Hyperlipidemia, unspecified: Secondary | ICD-10-CM

## 2014-06-12 NOTE — Assessment & Plan Note (Signed)
Edward Bishop is doing well. No recent episodes of SVT Will see as needed. He said that he will call in several years for a follow up visit.

## 2014-06-12 NOTE — Patient Instructions (Signed)
Your physician recommends that you schedule a follow-up appointment in: as needed with Dr. Acie Fredrickson
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
## 2014-06-12 NOTE — Assessment & Plan Note (Signed)
He's had mild elevationof his cholesterol levels the past but his most recent levels were okay. He can check them again in several years.

## 2014-07-13 ENCOUNTER — Encounter: Payer: Self-pay | Admitting: Internal Medicine

## 2015-02-05 ENCOUNTER — Other Ambulatory Visit: Payer: BLUE CROSS/BLUE SHIELD

## 2015-02-05 ENCOUNTER — Ambulatory Visit (INDEPENDENT_AMBULATORY_CARE_PROVIDER_SITE_OTHER): Payer: BLUE CROSS/BLUE SHIELD | Admitting: Internal Medicine

## 2015-02-05 ENCOUNTER — Encounter: Payer: Self-pay | Admitting: Internal Medicine

## 2015-02-05 VITALS — BP 122/84 | HR 82 | Temp 98.4°F | Ht 72.0 in | Wt 172.0 lb

## 2015-02-05 DIAGNOSIS — Z202 Contact with and (suspected) exposure to infections with a predominantly sexual mode of transmission: Secondary | ICD-10-CM

## 2015-02-05 DIAGNOSIS — N4889 Other specified disorders of penis: Secondary | ICD-10-CM

## 2015-02-05 DIAGNOSIS — N489 Disorder of penis, unspecified: Secondary | ICD-10-CM

## 2015-02-05 LAB — HIV ANTIBODY (ROUTINE TESTING W REFLEX): HIV 1&2 Ab, 4th Generation: NONREACTIVE

## 2015-02-05 NOTE — Progress Notes (Signed)
Pre visit review using our clinic review tool, if applicable. No additional management support is needed unless otherwise documented below in the visit note. 
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
## 2015-02-05 NOTE — Assessment & Plan Note (Signed)
[facility] for std labs per pt request

## 2015-02-05 NOTE — Assessment & Plan Note (Signed)
Appears benign lesion, ok to follow for now, no specific tx further needed

## 2015-02-05 NOTE — Patient Instructions (Signed)

## 2015-02-06 LAB — RPR

## 2015-02-06 LAB — HEPATITIS PANEL, ACUTE
HCV Ab: NEGATIVE
Hep A IgM: NONREACTIVE
Hep B C IgM: NONREACTIVE
Hepatitis B Surface Ag: NEGATIVE

## 2015-02-08 LAB — HSV 2 ANTIBODY, IGG: HSV 2 Glycoprotein G Ab, IgG: <0.1 IV

## 2015-02-09 LAB — GC/CHLAMYDIA PROBE AMP, URINE
Chlamydia, Swab/Urine, PCR: NEGATIVE
GC Probe Amp, Urine: NEGATIVE

## 2017-07-25 ENCOUNTER — Telehealth: Payer: Self-pay | Admitting: Internal Medicine

## 2017-07-25 NOTE — Telephone Encounter (Signed)
[facility] is in epic as well. They will already be able to see all this information.

## 2017-07-25 NOTE — Telephone Encounter (Signed)
Copied from [place] [id number]. Topic: General - Other >> Jul 25, 2017  9:31 AM Darl Householder, RMA wrote: Reason for CRM: Edwena Blow from [facility] needs records faxed to [phone] for any recent EKG, labs, and office notes, pt is re-establishing care

## 2017-08-10 ENCOUNTER — Encounter: Payer: Self-pay | Admitting: Cardiovascular Disease

## 2017-08-10 ENCOUNTER — Ambulatory Visit: Payer: BLUE CROSS/BLUE SHIELD | Admitting: Cardiovascular Disease

## 2017-08-10 VITALS — BP 142/88 | HR 74 | Ht 72.0 in | Wt 187.4 lb

## 2017-08-10 DIAGNOSIS — I471 Supraventricular tachycardia, unspecified: Secondary | ICD-10-CM

## 2017-08-10 DIAGNOSIS — Z13220 Encounter for screening for lipoid disorders: Secondary | ICD-10-CM

## 2017-08-10 DIAGNOSIS — N400 Enlarged prostate without lower urinary tract symptoms: Secondary | ICD-10-CM

## 2017-08-10 DIAGNOSIS — I10 Essential (primary) hypertension: Secondary | ICD-10-CM

## 2017-08-10 LAB — LIPID PANEL
Chol/HDL Ratio: 2.7 ratio (ref 0.0–5.0)
Cholesterol, Total: 137 mg/dL (ref 100–199)
HDL: 50 mg/dL (ref >39)
LDL Calculated: 69 mg/dL (ref 0–99)
Triglycerides: 92 mg/dL (ref 0–149)
VLDL Cholesterol Cal: 18 mg/dL (ref 5–40)

## 2017-08-10 LAB — HEPATIC FUNCTION PANEL
ALT: 20 IU/L (ref 0–44)
AST: 21 IU/L (ref 0–40)
Albumin: 4.5 g/dL (ref 3.5–5.5)
Alkaline Phosphatase: 86 IU/L (ref 39–117)
Bilirubin Total: 0.4 mg/dL (ref 0.0–1.2)
Bilirubin, Direct: 0.14 mg/dL (ref 0.00–0.40)
Total Protein: 6.7 g/dL (ref 6.0–8.5)

## 2017-08-10 LAB — BASIC METABOLIC PANEL
BUN/Creatinine Ratio: 7 — ABNORMAL LOW (ref 9–20)
BUN: 7 mg/dL (ref 6–24)
CO2: 23 mmol/L (ref 20–29)
Calcium: 9.3 mg/dL (ref 8.7–10.2)
Chloride: 102 mmol/L (ref 96–106)
Creatinine, Ser: 1 mg/dL (ref 0.76–1.27)
GFR calc Af Amer: 98 mL/min/{1.73_m2} (ref >59)
GFR calc non Af Amer: 84 mL/min/{1.73_m2} (ref >59)
Glucose: 81 mg/dL (ref 65–99)
Potassium: 3.8 mmol/L (ref 3.5–5.2)
Sodium: 141 mmol/L (ref 134–144)

## 2017-08-10 LAB — PSA: Prostate Specific Ag, Serum: 0.5 ng/mL (ref 0.0–4.0)

## 2017-08-10 MED ORDER — LOSARTAN POTASSIUM 50 MG PO TABS: 50.0000 mg | ORAL_TABLET | Freq: Every day | ORAL | 3 refills | Status: DC

## 2017-08-10 NOTE — Patient Instructions (Addendum)
Medication Instructions:  Your physician has recommended you make the following change in your medication:  START Losartan (Cozaar) 50 mg one daily    Labwork: TODAY - PSA, Cholesterol, liver panel, basic metabolic panel  Your physician recommends that you return for lab work on Friday  February 1 you may come in anytime between 7:30 am and 4:30 pm   Testing/Procedures: None Ordered    Follow-Up: Your physician wants you to follow-up in: 1 year with Dr. Acie Fredrickson.  You will receive a reminder letter in the mail two months in advance. If you don't receive a letter, please call our office to schedule the follow-up appointment.   If you need a refill on your cardiac medications before your next appointment, please call your pharmacy.   Thank you for choosing [facility]! Christen Bame, RN [phone]
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
## 2017-08-15 ENCOUNTER — Telehealth: Payer: Self-pay | Admitting: Cardiovascular Disease

## 2017-08-15 NOTE — Telephone Encounter (Signed)
New message  Pt verbalized that he is calling for RN  For his lab results

## 2017-08-15 NOTE — Telephone Encounter (Signed)
Reviewed lab results with patient who verbalized understanding. He is aware of lab appointment on 2/1 for repeat bmet. He thanked me for the call.

## 2017-08-31 ENCOUNTER — Other Ambulatory Visit: Payer: BLUE CROSS/BLUE SHIELD | Admitting: *Deleted

## 2017-08-31 DIAGNOSIS — I471 Supraventricular tachycardia: Secondary | ICD-10-CM

## 2017-08-31 DIAGNOSIS — N400 Enlarged prostate without lower urinary tract symptoms: Secondary | ICD-10-CM

## 2017-08-31 DIAGNOSIS — I10 Essential (primary) hypertension: Secondary | ICD-10-CM

## 2017-08-31 DIAGNOSIS — Z13220 Encounter for screening for lipoid disorders: Secondary | ICD-10-CM

## 2017-08-31 LAB — BASIC METABOLIC PANEL
BUN/Creatinine Ratio: 7 — ABNORMAL LOW (ref 9–20)
BUN: 8 mg/dL (ref 6–24)
CO2: 21 mmol/L (ref 20–29)
Calcium: 9.6 mg/dL (ref 8.7–10.2)
Chloride: 100 mmol/L (ref 96–106)
Creatinine, Ser: 1.07 mg/dL (ref 0.76–1.27)
GFR calc Af Amer: 90 mL/min/{1.73_m2} (ref >59)
GFR calc non Af Amer: 78 mL/min/{1.73_m2} (ref >59)
Glucose: 93 mg/dL (ref 65–99)
Potassium: 4.2 mmol/L (ref 3.5–5.2)
Sodium: 138 mmol/L (ref 134–144)

## 2017-09-07 ENCOUNTER — Telehealth: Payer: Self-pay | Admitting: Cardiovascular Disease

## 2017-09-07 NOTE — Telephone Encounter (Signed)
New Message ° ° ° ° °Patient is calling in reference to lab results. Please call to discuss.  °

## 2017-09-07 NOTE — Telephone Encounter (Signed)
Spoke with patient who called to ask about recent bmet. I advised him of stable results and to continue current dose of Losartan. He states his BP today is 114/88 mmHg. He states he is thankful for the improved BP. He asks about taking low dose aspirin daily and I advised that it is not necessary for him to do so but that he may do so if he chooses. I advised him to take only 81 mg daily. I asked about family hx of CAD and he denies. He states he had cardiac cath many years ago and it did not show any CAD. He denies recent chest pain or other concerns. He is aware of risks of bleeding associated with aspirin. I advised him to call back with questions or concerns prior to f/u visit. He thanked me for the call.

## 2018-08-26 ENCOUNTER — Telehealth: Payer: Self-pay | Admitting: Cardiovascular Disease

## 2018-08-26 MED ORDER — LOSARTAN POTASSIUM 50 MG PO TABS: 50.0000 mg | ORAL_TABLET | Freq: Every day | ORAL | 0 refills | Status: DC

## 2018-08-26 NOTE — Telephone Encounter (Signed)
New message    *STAT* If patient is at the pharmacy, call can be transferred to refill team.   1. Which medications need to be refilled? (please list name of each medication and dose if known)   losartan (COZAAR) 50 MG tablet(Expired)     2. Which pharmacy/location (including street and city if local pharmacy) is medication to be sent to?[id number] - [place], [place].  3. Do they need a 30 day or 90 day supply? [place]

## 2018-08-26 NOTE — Telephone Encounter (Signed)
Pt's medication was sent to pt's pharmacy as requested. Confirmation received.  °
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
## 2018-09-13 ENCOUNTER — Encounter: Payer: Self-pay | Admitting: Physician Assistant

## 2018-09-13 ENCOUNTER — Ambulatory Visit: Payer: BLUE CROSS/BLUE SHIELD | Admitting: Physician Assistant

## 2018-09-13 VITALS — BP 120/80 | HR 85 | Ht 72.0 in | Wt 181.8 lb

## 2018-09-13 DIAGNOSIS — Z720 Tobacco use: Secondary | ICD-10-CM | POA: Diagnosis not present

## 2018-09-13 DIAGNOSIS — I10 Essential (primary) hypertension: Secondary | ICD-10-CM | POA: Insufficient documentation

## 2018-09-13 DIAGNOSIS — Z8042 Family history of malignant neoplasm of prostate: Secondary | ICD-10-CM

## 2018-09-13 DIAGNOSIS — I493 Ventricular premature depolarization: Secondary | ICD-10-CM

## 2018-09-13 HISTORY — DX: Essential (primary) hypertension: I10

## 2018-09-13 MED ORDER — LOSARTAN POTASSIUM 50 MG PO TABS: 50.0000 mg | ORAL_TABLET | Freq: Every day | ORAL | 3 refills | Status: DC

## 2018-09-13 NOTE — Patient Instructions (Signed)
Medication Instructions:  Your physician recommends that you continue on your current medications as directed. Please refer to the Current Medication list given to you today.  If you need a refill on your cardiac medications before your next appointment, please call your pharmacy.   Lab work: TODAY: BMET, MAG & PSA  If you have labs (blood work) drawn today and your tests are completely normal, you will receive your results only by: Marland Kitchen MyChart Message (if you have MyChart) OR . A paper copy in the mail If you have any lab test that is abnormal or we need to change your treatment, we will call you to review the results.  Testing/Procedures: None  Follow-Up: At [facility], you and your health needs are our priority.  As part of our continuing mission to provide you with exceptional heart care, we have created designated Provider Care Teams.  These Care Teams include your primary Cardiologist (physician) and Advanced Practice Providers (APPs -  Physician Assistants and Nurse Practitioners) who all work together to provide you with the care you need, when you need it. You will need a follow up appointment in:  1 years.  Please call our office 2 months in advance to schedule this appointment.  You may see Mertie Moores, MD or one of the following Advanced Practice Providers on your designated Care Team: Richardson Dopp, PA-C [place], [place] . Daune Perch, NP  Any Other Special Instructions Will Be Listed Below (If Applicable).

## 2018-09-14 LAB — BASIC METABOLIC PANEL
BUN/Creatinine Ratio: 6 — ABNORMAL LOW (ref 9–20)
BUN: 6 mg/dL (ref 6–24)
CO2: 20 mmol/L (ref 20–29)
Calcium: 9.9 mg/dL (ref 8.7–10.2)
Chloride: 103 mmol/L (ref 96–106)
Creatinine, Ser: 1.07 mg/dL (ref 0.76–1.27)
GFR calc Af Amer: 89 mL/min/{1.73_m2} (ref >59)
GFR calc non Af Amer: 77 mL/min/{1.73_m2} (ref >59)
Glucose: 74 mg/dL (ref 65–99)
Potassium: 4.5 mmol/L (ref 3.5–5.2)
Sodium: 142 mmol/L (ref 134–144)

## 2018-09-14 LAB — MAGNESIUM: Magnesium: 2.1 mg/dL (ref 1.6–2.3)

## 2018-09-14 LAB — PSA: Prostate Specific Ag, Serum: 0.7 ng/mL (ref 0.0–4.0)

## 2019-11-05 ENCOUNTER — Ambulatory Visit: Payer: Self-pay | Admitting: Cardiovascular Disease

## 2019-11-05 ENCOUNTER — Encounter: Payer: Self-pay | Admitting: Cardiovascular Disease

## 2019-11-05 ENCOUNTER — Other Ambulatory Visit: Payer: Self-pay

## 2019-11-05 VITALS — BP 128/82 | HR 84 | Ht 72.0 in | Wt 172.5 lb

## 2019-11-05 DIAGNOSIS — I10 Essential (primary) hypertension: Secondary | ICD-10-CM

## 2019-11-05 DIAGNOSIS — I471 Supraventricular tachycardia: Secondary | ICD-10-CM

## 2019-11-05 DIAGNOSIS — Z129 Encounter for screening for malignant neoplasm, site unspecified: Secondary | ICD-10-CM | POA: Diagnosis not present

## 2019-11-05 MED ORDER — LOSARTAN POTASSIUM 50 MG PO TABS: 50.0000 mg | ORAL_TABLET | Freq: Every day | ORAL | 3 refills | Status: DC
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
## 2019-11-05 NOTE — Patient Instructions (Addendum)
Medication Instructions:  *If you need a refill on your cardiac medications before your next appointment, please call your pharmacy*  Lab Work: Your physician recommends that you lab work today- BMET, Liver Panel,  PSA  If you have labs (blood work) drawn today and your tests are completely normal, you will receive your results only by: Marland Kitchen MyChart Message (if you have MyChart) OR . A paper copy in the mail If you have any lab test that is abnormal or we need to change your treatment, we will call you to review the results.  Testing/Procedures: None ordered today. Ask Dr. Ronnald Ramp about getting a screening lung CT scan for long-term smokers.  Follow-Up: At [facility], you and your health needs are our priority.  As part of our continuing mission to provide you with exceptional heart care, we have created designated Provider Care Teams.  These Care Teams include your primary Cardiologist (physician) and Advanced Practice Providers (APPs -  Physician Assistants and Nurse Practitioners) who all work together to provide you with the care you need, when you need it.  We recommend signing up for the patient portal called "MyChart".  Sign up information is provided on this After Visit Summary.  MyChart is used to connect with patients for Virtual Visits (Telemedicine).  Patients are able to view lab/test results, encounter notes, upcoming appointments, etc.  Non-urgent messages can be sent to your provider as well.   To learn more about what you can do with MyChart, go to [email or web address].    Your next appointment:   12 month(s)  The format for your next appointment:   Either In Person or Virtual  Provider:   You may see Mertie Moores, MD or one of the following Advanced Practice Providers on your designated Care Team:    Richardson Dopp, PA-C  [place], [place]

## 2019-11-06 LAB — BASIC METABOLIC PANEL
BUN/Creatinine Ratio: 7 — ABNORMAL LOW (ref 9–20)
BUN: 7 mg/dL (ref 6–24)
CO2: 21 mmol/L (ref 20–29)
Calcium: 9.4 mg/dL (ref 8.7–10.2)
Chloride: 102 mmol/L (ref 96–106)
Creatinine, Ser: 1.05 mg/dL (ref 0.76–1.27)
GFR calc Af Amer: 91 mL/min/{1.73_m2} (ref >59)
GFR calc non Af Amer: 78 mL/min/{1.73_m2} (ref >59)
Glucose: 93 mg/dL (ref 65–99)
Potassium: 4.1 mmol/L (ref 3.5–5.2)
Sodium: 138 mmol/L (ref 134–144)

## 2019-11-06 LAB — HEPATIC FUNCTION PANEL
ALT: 20 IU/L (ref 0–44)
AST: 22 IU/L (ref 0–40)
Albumin: 4.7 g/dL (ref 3.8–4.9)
Alkaline Phosphatase: 95 IU/L (ref 39–117)
Bilirubin Total: 0.3 mg/dL (ref 0.0–1.2)
Bilirubin, Direct: 0.12 mg/dL (ref 0.00–0.40)
Total Protein: 6.7 g/dL (ref 6.0–8.5)

## 2019-11-06 LAB — PSA: Prostate Specific Ag, Serum: 1.2 ng/mL (ref 0.0–4.0)

## 2019-11-11 ENCOUNTER — Telehealth: Payer: Self-pay | Admitting: Cardiovascular Disease

## 2019-11-11 NOTE — Telephone Encounter (Signed)
Reviewed lab results with patient and all questions were answered to his satisfaction. He thanked me for the call.

## 2019-11-11 NOTE — Telephone Encounter (Signed)
Patient states he is requesting results from lab work completed on 11/05/19. Please return call to discuss.

## 2019-12-08 ENCOUNTER — Other Ambulatory Visit: Payer: Self-pay | Admitting: Physician Assistant

## 2020-01-12 ENCOUNTER — Other Ambulatory Visit: Payer: Self-pay

## 2020-01-13 ENCOUNTER — Encounter: Payer: Self-pay | Admitting: Family Medicine

## 2020-01-13 ENCOUNTER — Ambulatory Visit (INDEPENDENT_AMBULATORY_CARE_PROVIDER_SITE_OTHER): Payer: BC Managed Care – PPO | Admitting: Family Medicine

## 2020-01-13 VITALS — BP 138/82 | HR 90 | Temp 97.1°F | Ht 73.0 in | Wt 166.0 lb

## 2020-01-13 DIAGNOSIS — I459 Conduction disorder, unspecified: Secondary | ICD-10-CM

## 2020-01-13 DIAGNOSIS — Z720 Tobacco use: Secondary | ICD-10-CM | POA: Diagnosis not present

## 2020-01-13 DIAGNOSIS — Z7289 Other problems related to lifestyle: Secondary | ICD-10-CM

## 2020-01-13 DIAGNOSIS — I10 Essential (primary) hypertension: Secondary | ICD-10-CM | POA: Diagnosis not present

## 2020-01-13 DIAGNOSIS — Z789 Other specified health status: Secondary | ICD-10-CM

## 2020-01-13 DIAGNOSIS — W57XXXA Bitten or stung by nonvenomous insect and other nonvenomous arthropods, initial encounter: Secondary | ICD-10-CM

## 2020-01-13 DIAGNOSIS — H6191 Disorder of right external ear, unspecified: Secondary | ICD-10-CM

## 2020-01-13 MED ORDER — DOXYCYCLINE HYCLATE 100 MG PO TABS: 100.0000 mg | ORAL_TABLET | Freq: Two times a day (BID) | ORAL | 0 refills | Status: DC

## 2020-01-13 NOTE — Patient Instructions (Addendum)
Coping with Quitting Smoking  Quitting smoking is a physical and mental challenge. You will face cravings, withdrawal symptoms, and temptation. Before quitting, work with your health care provider to make a plan that can help you cope. Preparation can help you quit and keep you from giving in. How can I cope with cravings? Cravings usually last for 5-10 minutes. If you get through it, the craving will pass. Consider taking the following actions to help you cope with cravings:  Keep your mouth busy: ? Chew sugar-free gum. ? Suck on hard candies or a straw. ? Brush your teeth.  Keep your hands and body busy: ? Immediately change to a different activity when you feel a craving. ? Squeeze or play with a ball. ? Do an activity or a hobby, like making bead jewelry, practicing needlepoint, or working with wood. ? Mix up your normal routine. ? Take a short exercise break. Go for a quick walk or run up and down stairs. ? Spend time in public places where smoking is not allowed.  Focus on doing something kind or helpful for someone else.  Call a friend or family member to talk during a craving.  Join a support group.  Call a quit line, such as 1-800-QUIT-NOW.  Talk with your health care provider about medicines that might help you cope with cravings and make quitting easier for you. How can I deal with withdrawal symptoms? Your body may experience negative effects as it tries to get used to not having nicotine in the system. These effects are called withdrawal symptoms. They may include:  Feeling hungrier than normal.  Trouble concentrating.  Irritability.  Trouble sleeping.  Feeling depressed.  Restlessness and agitation.  Craving a cigarette. To manage withdrawal symptoms:  Avoid places, people, and activities that trigger your cravings.  Remember why you want to quit.  Get plenty of sleep.  Avoid coffee and other caffeinated drinks. These may worsen some of your  symptoms. How can I handle social situations? Social situations can be difficult when you are quitting smoking, especially in the first few weeks. To manage this, you can:  Avoid parties, bars, and other social situations where people might be smoking.  Avoid alcohol.  Leave right away if you have the urge to smoke.  Explain to your family and friends that you are quitting smoking. Ask for understanding and support.  Plan activities with friends or family where smoking is not an option. What are some ways I can cope with stress? Wanting to smoke may cause stress, and stress can make you want to smoke. Find ways to manage your stress. Relaxation techniques can help. For example:  Breathe slowly and deeply, in through your nose and out through your mouth.  Listen to soothing, relaxing music.  Talk with a family member or friend about your stress.  Light a candle.  Soak in a bath or take a shower.  Think about a peaceful place. What are some ways I can prevent weight gain? Be aware that many people gain weight after they quit smoking. However, not everyone does. To keep from gaining weight, have a plan in place before you quit and stick to the plan after you quit. Your plan should include:  Having healthy snacks. When you have a craving, it may help to: ? Eat plain popcorn, crunchy carrots, celery, or other cut vegetables. ? Chew sugar-free gum.  Changing how you eat: ? Eat small portion sizes at meals. ? Eat 4-6 small meals   throughout the day instead of 1-2 large meals a day. ? Be mindful when you eat. Do not watch television or do other things that might distract you as you eat.  Exercising regularly: ? Make time to exercise each day. If you do not have time for a long workout, do short bouts of exercise for 5-10 minutes several times a day. ? Do some form of strengthening exercise, like weight lifting, and some form of aerobic exercise, like running or swimming.  Drinking  plenty of water or other low-calorie or no-calorie drinks. Drink 6-8 glasses of water daily, or as much as instructed by your health care provider. Summary  Quitting smoking is a physical and mental challenge. You will face cravings, withdrawal symptoms, and temptation to smoke again. Preparation can help you as you go through these challenges.  You can cope with cravings by keeping your mouth busy (such as by chewing gum), keeping your body and hands busy, and making calls to family, friends, or a helpline for people who want to quit smoking.  You can cope with withdrawal symptoms by avoiding places where people smoke, avoiding drinks with caffeine, and getting plenty of rest.  Ask your health care provider about the different ways to prevent weight gain, avoid stress, and handle social situations. This information is not intended to replace advice given to you by your health care provider. Make sure you discuss any questions you have with your health care provider. Document Revised: 06/29/2017 Document Reviewed: 07/14/2016 Elsevier Patient Education  [place].  Alcohol Abuse and Dependence Information, Adult Alcohol is a widely available drug. People drink alcohol in different amounts. People who drink alcohol very often and in large amounts often have problems during and after drinking. They may develop what is called an alcohol use disorder. There are two main types of alcohol use disorders:  Alcohol abuse. This is when you use alcohol too much or too often. You may use alcohol to make yourself feel happy or to reduce stress. You may have a hard time setting a limit on the amount you drink.  Alcohol dependence. This is when you use alcohol consistently for a period of time, and your body changes as a result. This can make it hard to stop drinking because you may start to feel sick or feel different when you do not use alcohol. These symptoms are known as withdrawal. How can alcohol  abuse and dependence affect me? Alcohol abuse and dependence can have a negative effect on your life. Drinking too much can lead to addiction. You may feel like you need alcohol to function normally. You may drink alcohol before work in the morning, during the day, or as soon as you get home from work in the evening. These actions can result in:  Poor work performance.  Job loss.  Financial problems.  Car crashes or criminal charges from driving after drinking alcohol.  Problems in your relationships with friends and family.  Losing the trust and respect of coworkers, friends, and family. Drinking heavily over a long period of time can permanently damage your body and brain, and can cause lifelong health issues, such as:  Damage to your liver or pancreas.  Heart problems, high blood pressure, or stroke.  Certain cancers.  Decreased ability to fight infections.  Brain or nerve damage.  Depression.  Early (premature) death. If you are careless or you crave alcohol, it is easy to drink more than your body can handle (overdose). Alcohol overdose  is a serious situation that requires hospitalization. It may lead to permanent injuries or death. What can increase my risk?  Having a family history of alcohol abuse.  Having depression or other mental health conditions.  Beginning to drink at an early age.  Binge drinking often.  Experiencing trauma, stress, and an unstable home life during childhood.  Spending time with people who drink often. What actions can I take to prevent or manage alcohol abuse and dependence?  Do not drink alcohol if: ? Your health care provider tells you not to drink. ? You are pregnant, may be pregnant, or are planning to become pregnant.  If you drink alcohol: ? Limit how much you use to:  0-1 drink a day for women.  0-2 drinks a day for men. ? Be aware of how much alcohol is in your drink. In the [place] one drink equals one 12 oz bottle of beer  (355 mL), one 5 oz glass of wine (148 mL), or one 1 oz glass of hard liquor (44 mL).  Stop drinking if you have been drinking too much. This can be very hard to do if you are used to abusing alcohol. If you begin to have withdrawal symptoms, talk with your health care provider or a person that you trust. These symptoms may include anxiety, shaky hands, headache, nausea, sweating, or not being able to sleep.  Choose to drink nonalcoholic beverages in social gatherings and places where there may be alcohol. Activity  Spend more time on activities that you enjoy that do not involve alcohol, like hobbies or exercise.  Find healthy ways to cope with stress, such as exercise, meditation, or spending time with people you care about. General information  Talk to your family, coworkers, and friends about supporting you in your efforts to stop drinking. If they drink, ask them not to drink around you. Spend more time with people who do not drink alcohol.  If you think that you have an alcohol dependency problem: ? Tell friends or family about your concerns. ? Talk with your health care provider or another health professional about where to get help. ? Work with a Transport planner and a Regulatory affairs officer. ? Consider joining a support group for people who struggle with alcohol abuse and dependence. Where to find support   Your health care provider.  SMART Recovery: www.smartrecovery.org Therapy and support groups  Local treatment centers or chemical dependency counselors.  Local AA groups in your community: NicTax.com.pt Where to find more information  Centers for Disease Control and Prevention: http://www.wolf.info/  National Institute on Alcohol Abuse and Alcoholism: http://www.bradshaw.com/  Alcoholics Anonymous (AA): NicTax.com.pt Contact a health care provider if:  You drank more or for longer than you intended on more than one occasion.  You tried to stop drinking or to cut back on how much you  drink, but you were not able to.  You often drink to the point of vomiting or passing out.  You want to drink so badly that you cannot think about anything else.  You have problems in your life due to drinking, but you continue to drink.  You keep drinking even though you feel anxious, depressed, or have experienced memory loss.  You have stopped doing the things you used to enjoy in order to drink.  You have to drink more than you used to in order to get the effect you want.  You experience anxiety, sweating, nausea, shakiness, and trouble sleeping when you try to stop  drinking. Get help right away if:  You have thoughts about hurting yourself or others.  You have serious withdrawal symptoms, including: ? Confusion. ? Racing heart. ? High blood pressure. ? Fever. If you ever feel like you may hurt yourself or others, or have thoughts about taking your own life, get help right away. You can go to your nearest emergency department or call:  Your local emergency services (911 in the [place]  A suicide crisis helpline, such as the [facility] at 332-569-5197. This is open 24 hours a day. Summary  Alcohol abuse and dependence can have a negative effect on your life. Drinking too much or too often can lead to addiction.  If you drink alcohol, limit how much you use.  If you are having trouble keeping your drinking under control, find ways to change your behavior. Hobbies, calming activities, exercise, or support groups can help.  If you feel you need help with changing your drinking habits, talk with your health care provider, a good friend, or a therapist, or go to an [place] group. This information is not intended to replace advice given to you by your health care provider. Make sure you discuss any questions you have with your health care provider. Document Revised: 11/05/2018 Document Reviewed: 09/24/2018 Elsevier Patient Education  [place].
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
## 2020-01-16 ENCOUNTER — Other Ambulatory Visit: Payer: Self-pay | Admitting: *Deleted

## 2020-01-16 DIAGNOSIS — F17210 Nicotine dependence, cigarettes, uncomplicated: Secondary | ICD-10-CM

## 2020-01-16 NOTE — Progress Notes (Signed)
Chest  

## 2020-02-12 ENCOUNTER — Encounter: Payer: Self-pay | Admitting: Physician Assistant

## 2020-02-12 ENCOUNTER — Other Ambulatory Visit: Payer: Self-pay

## 2020-02-12 ENCOUNTER — Ambulatory Visit: Payer: BC Managed Care – PPO | Admitting: Physician Assistant

## 2020-02-12 DIAGNOSIS — D485 Neoplasm of uncertain behavior of skin: Secondary | ICD-10-CM

## 2020-02-12 NOTE — Patient Instructions (Signed)

## 2020-02-12 NOTE — Progress Notes (Signed)
   New Patient   Subjective  Edward Bishop is a 59 y.o. male who presents for the following: Annual Exam (right ear- x months- crusty and growing).  The following portions of the chart were reviewed this encounter and updated as appropriate:     Objective  Well appearing patient in no apparent distress; mood and affect are within normal limits.  All skin waist up examined.  Objective  Right Ear: Papule-sore  Objective  Right Occipital Scalp: Thick brown crust   Assessment & Plan  Neoplasm of uncertain behavior of skin (2) Right Ear  Skin / nail biopsy Type of biopsy: tangential   Informed consent: discussed and consent obtained   Timeout: patient name, date of birth, surgical site, and procedure verified   Anesthesia: the lesion was anesthetized in a standard fashion   Anesthetic:  1% lidocaine w/ epinephrine 1-100,000 local infiltration Instrument used: flexible razor blade   Hemostasis achieved with: aluminum chloride and electrodesiccation   Outcome: patient tolerated procedure well   Post-procedure details: wound care instructions given    Right Occipital Scalp  Skin / nail biopsy Type of biopsy: tangential   Informed consent: discussed and consent obtained   Timeout: patient name, date of birth, surgical site, and procedure verified   Anesthesia: the lesion was anesthetized in a standard fashion   Anesthetic:  1% lidocaine w/ epinephrine 1-100,000 local infiltration Instrument used: flexible razor blade   Hemostasis achieved with: aluminum chloride and electrodesiccation   Outcome: patient tolerated procedure well   Post-procedure details: wound care instructions given

## 2020-02-19 ENCOUNTER — Telehealth: Payer: Self-pay | Admitting: Physician Assistant

## 2020-02-19 NOTE — Telephone Encounter (Signed)
Phone call from patient wanting his pathology results.  Patient informed that Fonnie Jarvis has to review the results and once she does someone will give him a call back.

## 2020-02-19 NOTE — Telephone Encounter (Signed)
Results, KRS 

## 2020-02-23 ENCOUNTER — Other Ambulatory Visit: Payer: Self-pay

## 2020-02-23 ENCOUNTER — Ambulatory Visit (INDEPENDENT_AMBULATORY_CARE_PROVIDER_SITE_OTHER): Payer: BC Managed Care – PPO | Admitting: Acute Care

## 2020-02-23 ENCOUNTER — Ambulatory Visit
Admission: RE | Admit: 2020-02-23 | Discharge: 2020-02-23 | Disposition: A | Discharge status: Home / Self Care | Payer: BC Managed Care – PPO | Source: Ambulatory Visit | Attending: Acute Care | Admitting: Acute Care

## 2020-02-23 ENCOUNTER — Encounter: Payer: Self-pay | Admitting: Acute Care

## 2020-02-23 VITALS — BP 130/70 | HR 53 | Temp 97.3°F | Ht 72.0 in | Wt 164.8 lb

## 2020-02-23 DIAGNOSIS — Z122 Encounter for screening for malignant neoplasm of respiratory organs: Secondary | ICD-10-CM

## 2020-02-23 DIAGNOSIS — F17210 Nicotine dependence, cigarettes, uncomplicated: Secondary | ICD-10-CM

## 2020-02-23 NOTE — Progress Notes (Signed)
Shared Decision Making Visit Lung Cancer Screening Program [phone])   Eligibility:  Age 59 y.o.  Pack Years Smoking History Calculation 60   Pack year smoking history (# packs/per year x # years smoked)  Recent History of coughing up blood  no  Unexplained weight loss? no ( >Than 15 pounds within the last 6 months )  Prior History Lung / other cancer no (Diagnosis within the last 5 years already requiring surveillance chest CT Scans).  Smoking Status Current Smoker  Former Smokers: Years since quit: NA  Quit Date: NA  Visit Components:  Discussion included one or more decision making aids. yes  Discussion included risk/benefits of screening. yes  Discussion included potential follow up diagnostic testing for abnormal scans. yes  Discussion included meaning and risk of over diagnosis. yes  Discussion included meaning and risk of False Positives. yes  Discussion included meaning of total radiation exposure. yes  Counseling Included:  Importance of adherence to annual lung cancer LDCT screening. yes  Impact of comorbidities on ability to participate in the program. yes  Ability and willingness to under diagnostic treatment. yes  Smoking Cessation Counseling:  Current Smokers:   Discussed importance of smoking cessation. yes  Information about tobacco cessation classes and interventions provided to patient. yes  Patient provided with "ticket" for LDCT Scan. yes  Symptomatic Patient. no  Counseling NA  Diagnosis Code: Tobacco Use Z72.0  Asymptomatic Patient yes  Counseling (Intermediate counseling: > three minutes counseling) [id number]  Former Smokers:   Discussed the importance of maintaining cigarette abstinence. yes  Diagnosis Code: Personal History of Nicotine Dependence. P53.614  Information about tobacco cessation classes and interventions provided to patient. Yes  Patient provided with "ticket" for LDCT Scan. yes  Written Order for Lung Cancer  Screening with LDCT placed in Epic. Yes (CT Chest Lung Cancer Screening Low Dose W/O CM) [id number] Z12.2-Screening of respiratory organs Z87.891-Personal history of nicotine dependence  I have spent 25 minutes of face to face time with Edward Bishop discussing the risks and benefits of lung cancer screening. We viewed a power point together that explained in detail the above noted topics. We paused at intervals to allow for questions to be asked and answered to ensure understanding.We discussed that the single most powerful action that he can take to decrease his risk of developing lung cancer is to quit smoking. We discussed whether or not he is ready to commit to setting a quit date. We discussed options for tools to aid in quitting smoking including nicotine replacement therapy, non-nicotine medications, support groups, Quit Smart classes, and behavior modification. We discussed that often times setting smaller, more achievable goals, such as eliminating 1 cigarette a day for a week and then 2 cigarettes a day for a week can be helpful in slowly decreasing the number of cigarettes smoked. This allows for a sense of accomplishment as well as providing a clinical benefit. I gave him the " Be Stronger Than Your Excuses" card with contact information for community resources, classes, free nicotine replacement therapy, and access to mobile apps, text messaging, and on-line smoking cessation help. I have also given him my card and contact information in the event he needs to contact me. We discussed the time and location of the scan, and that either Doroteo Glassman RN or I will call with the results within 24-48 hours of receiving them. I have offered him  a copy of the power point we viewed  as a resource in the event they  need reinforcement of the concepts we discussed today in the office. The patient verbalized understanding of all of  the above and had no further questions upon leaving the office. They have my  contact information in the event they have any further questions.  I spent 3 minutes counseling on smoking cessation and the health risks of continued tobacco abuse.  I explained to the patient that there has been a high incidence of coronary artery disease noted on these exams. I explained that this is a non-gated exam therefore degree or severity cannot be determined. This patient is  Not currently on statin therapy. I have asked the patient to follow-up with their PCP regarding any incidental finding of coronary artery disease and management with diet or medication as their PCP  feels is clinically indicated. The patient verbalized understanding of the above and had no further questions upon completion of the visit.  Pt. Is using hard candy to help cut down on smoking. I have offered an OV with pharmacy to discuss smoking cessation. He declined. I have told him to call the office if he changes his mind.     Edward Spatz, NP 02/23/2020 11:57 AM

## 2020-02-23 NOTE — Patient Instructions (Signed)
Thank you for participating in the [facility]. It was our pleasure to meet you today. We will call you with the results of your scan within the next few days. Your scan will be assigned a Lung RADS category score by the physicians reading the scans.  This Lung RADS score determines follow up scanning.  See below for description of categories, and follow up screening recommendations. We will be in touch to schedule your follow up screening annually or based on recommendations of our providers. We will fax a copy of your scan results to your Primary Care Physician, or the physician who referred you to the program, to ensure they have the results. Please call the office if you have any questions or concerns regarding your scanning experience or results.  Our office number is [phone]. Please speak with Denise Phelps, RN. She is our Lung Cancer Screening RN. If she is unavailable when you call, please have the office staff send her a message. She will return your call at her earliest convenience. Remember, if your scan is normal, we will scan you annually as long as you continue to meet the criteria for the program. (Age 55-77, Current smoker or smoker who has quit within the last 15 years). If you are a smoker, remember, quitting is the single most powerful action that you can take to decrease your risk of lung cancer and other pulmonary, breathing related problems. We know quitting is hard, and we are here to help.  Please let us know if there is anything we can do to help you meet your goal of quitting. If you are a former smoker, congratulations. We are proud of you! Remain smoke free! Remember you can refer friends or family members through the number above.  We will screen them to make sure they meet criteria for the program. Thank you for helping us take better care of you by participating in Lung Screening.  Lung RADS Categories:  Lung RADS 1: no nodules  or definitely non-concerning nodules.  Recommendation is for a repeat annual scan in 12 months.  Lung RADS 2:  nodules that are non-concerning in appearance and behavior with a very low likelihood of becoming an active cancer. Recommendation is for a repeat annual scan in 12 months.  Lung RADS 3: nodules that are probably non-concerning , includes nodules with a low likelihood of becoming an active cancer.  Recommendation is for a 6-month repeat screening scan. Often noted after an upper respiratory illness. We will be in touch to make sure you have no questions, and to schedule your 6-month scan.  Lung RADS 4 A: nodules with concerning findings, recommendation is most often for a follow up scan in 3 months or additional testing based on our provider's assessment of the scan. We will be in touch to make sure you have no questions and to schedule the recommended 3 month follow up scan.  Lung RADS 4 B:  indicates findings that are concerning. We will be in touch with you to schedule additional diagnostic testing based on our provider's  assessment of the scan.   

## 2020-02-26 NOTE — Progress Notes (Signed)
Please call patient and let them  know their  low dose Ct was read as a Lung RADS 2: nodules that are benign in appearance and behavior with a very low likelihood of becoming a clinically active cancer due to size or lack of growth. Recommendation per radiology is for a repeat LDCT in 12 months. .Please let them  know we will order and schedule their  annual screening scan for 01/2021. Please let them  know there was notation of CAD on their  scan.  Please remind the patient  that this is a non-gated exam therefore degree or severity of disease  cannot be determined. Please have them  follow up with their PCP regarding potential risk factor modification, dietary therapy or pharmacologic therapy if clinically indicated. Pt.  is not currently on statin therapy. Please place order for annual  screening scan for  01/2021 and fax results to PCP. Thanks so much.

## 2020-03-01 ENCOUNTER — Other Ambulatory Visit: Payer: Self-pay | Admitting: *Deleted

## 2020-03-01 ENCOUNTER — Telehealth: Payer: Self-pay | Admitting: Physician Assistant

## 2020-03-01 DIAGNOSIS — F17210 Nicotine dependence, cigarettes, uncomplicated: Secondary | ICD-10-CM

## 2020-03-01 NOTE — Telephone Encounter (Signed)
Patient calling again for biopsy results. He states that he has not heard back from [place]. I informed patient that we are waiting on K[facility] to review results and that she will be back in the office on 8/4. Patient voiced understanding.

## 2020-03-01 NOTE — Telephone Encounter (Signed)
Pathology report given to patient.

## 2020-04-26 ENCOUNTER — Other Ambulatory Visit: Payer: Self-pay

## 2020-04-27 ENCOUNTER — Ambulatory Visit (INDEPENDENT_AMBULATORY_CARE_PROVIDER_SITE_OTHER): Payer: BC Managed Care – PPO | Admitting: Family Medicine

## 2020-04-27 ENCOUNTER — Encounter: Payer: Self-pay | Admitting: Family Medicine

## 2020-04-27 VITALS — BP 132/82 | HR 80 | Temp 97.4°F | Ht 72.0 in | Wt 163.8 lb

## 2020-04-27 DIAGNOSIS — I2584 Coronary atherosclerosis due to calcified coronary lesion: Secondary | ICD-10-CM | POA: Diagnosis not present

## 2020-04-27 DIAGNOSIS — Z7289 Other problems related to lifestyle: Secondary | ICD-10-CM | POA: Diagnosis not present

## 2020-04-27 DIAGNOSIS — E041 Nontoxic single thyroid nodule: Secondary | ICD-10-CM | POA: Diagnosis not present

## 2020-04-27 DIAGNOSIS — I10 Essential (primary) hypertension: Secondary | ICD-10-CM | POA: Diagnosis not present

## 2020-04-27 DIAGNOSIS — Z789 Other specified health status: Secondary | ICD-10-CM

## 2020-04-27 DIAGNOSIS — Z0000 Encounter for general adult medical examination without abnormal findings: Secondary | ICD-10-CM | POA: Diagnosis not present

## 2020-04-27 DIAGNOSIS — J439 Emphysema, unspecified: Secondary | ICD-10-CM

## 2020-04-27 DIAGNOSIS — I2510 Atherosclerotic heart disease of native coronary artery without angina pectoris: Secondary | ICD-10-CM | POA: Diagnosis not present

## 2020-04-27 DIAGNOSIS — Z720 Tobacco use: Secondary | ICD-10-CM

## 2020-04-27 LAB — LIPID PANEL
Cholesterol: 140 mg/dL (ref 0–200)
HDL: 58.2 mg/dL (ref >39.00)
LDL Cholesterol: 67 mg/dL (ref 0–99)
NonHDL: 82.22
Total CHOL/HDL Ratio: 2
Triglycerides: 74 mg/dL (ref 0.0–149.0)
VLDL: 14.8 mg/dL (ref 0.0–40.0)

## 2020-04-27 LAB — COMPREHENSIVE METABOLIC PANEL
ALT: 16 U/L (ref 0–53)
AST: 18 U/L (ref 0–37)
Albumin: 4.4 g/dL (ref 3.5–5.2)
Alkaline Phosphatase: 66 U/L (ref 39–117)
BUN: 8 mg/dL (ref 6–23)
CO2: 29 mEq/L (ref 19–32)
Calcium: 9.6 mg/dL (ref 8.4–10.5)
Chloride: 100 mEq/L (ref 96–112)
Creatinine, Ser: 1.1 mg/dL (ref 0.40–1.50)
GFR: 68.74 mL/min (ref >60.00)
Glucose, Bld: 96 mg/dL (ref 70–99)
Potassium: 4.6 mEq/L (ref 3.5–5.1)
Sodium: 134 mEq/L — ABNORMAL LOW (ref 135–145)
Total Bilirubin: 0.5 mg/dL (ref 0.2–1.2)
Total Protein: 6.7 g/dL (ref 6.0–8.3)

## 2020-04-27 LAB — URINALYSIS, ROUTINE W REFLEX MICROSCOPIC
Bilirubin Urine: NEGATIVE
Hgb urine dipstick: NEGATIVE
Ketones, ur: NEGATIVE
Leukocytes,Ua: NEGATIVE
Nitrite: NEGATIVE
RBC / HPF: NONE SEEN (ref 0–?)
Specific Gravity, Urine: 1.01 (ref 1.000–1.030)
Total Protein, Urine: NEGATIVE
Urine Glucose: NEGATIVE
Urobilinogen, UA: 0.2 (ref 0.0–1.0)
pH: 6 (ref 5.0–8.0)

## 2020-04-27 LAB — CBC
HCT: 45.7 % (ref 39.0–52.0)
Hemoglobin: 15.3 g/dL (ref 13.0–17.0)
MCHC: 33.4 g/dL (ref 30.0–36.0)
MCV: 101.6 fl — ABNORMAL HIGH (ref 78.0–100.0)
Platelets: 227 10*3/uL (ref 150.0–400.0)
RBC: 4.5 Mil/uL (ref 4.22–5.81)
RDW: 13.5 % (ref 11.5–15.5)
WBC: 8 10*3/uL (ref 4.0–10.5)

## 2020-04-27 LAB — TSH: TSH: 1.47 u[IU]/mL (ref 0.35–4.50)

## 2020-04-27 LAB — LDL CHOLESTEROL, DIRECT: Direct LDL: 67 mg/dL

## 2020-04-27 LAB — PSA: PSA: 0.59 ng/mL (ref 0.10–4.00)
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
## 2020-04-27 NOTE — Patient Instructions (Addendum)
Coping with Quitting Smoking  Quitting smoking is a physical and mental challenge. You will face cravings, withdrawal symptoms, and temptation. Before quitting, work with your health care provider to make a plan that can help you cope. Preparation can help you quit and keep you from giving in. How can I cope with cravings? Cravings usually last for 5-10 minutes. If you get through it, the craving will pass. Consider taking the following actions to help you cope with cravings:  Keep your mouth busy: ? Chew sugar-free gum. ? Suck on hard candies or a straw. ? Brush your teeth.  Keep your hands and body busy: ? Immediately change to a different activity when you feel a craving. ? Squeeze or play with a ball. ? Do an activity or a hobby, like making bead jewelry, practicing needlepoint, or working with wood. ? Mix up your normal routine. ? Take a short exercise break. Go for a quick walk or run up and down stairs. ? Spend time in public places where smoking is not allowed.  Focus on doing something kind or helpful for someone else.  Call a friend or family member to talk during a craving.  Join a support group.  Call a quit line, such as 1-800-QUIT-NOW.  Talk with your health care provider about medicines that might help you cope with cravings and make quitting easier for you. How can I deal with withdrawal symptoms? Your body may experience negative effects as it tries to get used to not having nicotine in the system. These effects are called withdrawal symptoms. They may include:  Feeling hungrier than normal.  Trouble concentrating.  Irritability.  Trouble sleeping.  Feeling depressed.  Restlessness and agitation.  Craving a cigarette. To manage withdrawal symptoms:  Avoid places, people, and activities that trigger your cravings.  Remember why you want to quit.  Get plenty of sleep.  Avoid coffee and other caffeinated drinks. These may worsen some of your  symptoms. How can I handle social situations? Social situations can be difficult when you are quitting smoking, especially in the first few weeks. To manage this, you can:  Avoid parties, bars, and other social situations where people might be smoking.  Avoid alcohol.  Leave right away if you have the urge to smoke.  Explain to your family and friends that you are quitting smoking. Ask for understanding and support.  Plan activities with friends or family where smoking is not an option. What are some ways I can cope with stress? Wanting to smoke may cause stress, and stress can make you want to smoke. Find ways to manage your stress. Relaxation techniques can help. For example:  Breathe slowly and deeply, in through your nose and out through your mouth.  Listen to soothing, relaxing music.  Talk with a family member or friend about your stress.  Light a candle.  Soak in a bath or take a shower.  Think about a peaceful place. What are some ways I can prevent weight gain? Be aware that many people gain weight after they quit smoking. However, not everyone does. To keep from gaining weight, have a plan in place before you quit and stick to the plan after you quit. Your plan should include:  Having healthy snacks. When you have a craving, it may help to: ? Eat plain popcorn, crunchy carrots, celery, or other cut vegetables. ? Chew sugar-free gum.  Changing how you eat: ? Eat small portion sizes at meals. ? Eat 4-6 small meals  throughout the day instead of 1-2 large meals a day. ? Be mindful when you eat. Do not watch television or do other things that might distract you as you eat.  Exercising regularly: ? Make time to exercise each day. If you do not have time for a long workout, do short bouts of exercise for 5-10 minutes several times a day. ? Do some form of strengthening exercise, like weight lifting, and some form of aerobic exercise, like running or swimming.  Drinking  plenty of water or other low-calorie or no-calorie drinks. Drink 6-8 glasses of water daily, or as much as instructed by your health care provider. Summary  Quitting smoking is a physical and mental challenge. You will face cravings, withdrawal symptoms, and temptation to smoke again. Preparation can help you as you go through these challenges.  You can cope with cravings by keeping your mouth busy (such as by chewing gum), keeping your body and hands busy, and making calls to family, friends, or a helpline for people who want to quit smoking.  You can cope with withdrawal symptoms by avoiding places where people smoke, avoiding drinks with caffeine, and getting plenty of rest.  Ask your health care provider about the different ways to prevent weight gain, avoid stress, and handle social situations. This information is not intended to replace advice given to you by your health care provider. Make sure you discuss any questions you have with your health care provider. Document Revised: 06/29/2017 Document Reviewed: 07/14/2016 Elsevier Patient Education  2020 [place] Maintenance, Male Adopting a healthy lifestyle and getting preventive care are important in promoting health and wellness. Ask your health care provider about:  The right schedule for you to have regular tests and exams.  Things you can do on your own to prevent diseases and keep yourself healthy. What should I know about diet, weight, and exercise? Eat a healthy diet   Eat a diet that includes plenty of vegetables, fruits, low-fat dairy products, and lean protein.  Do not eat a lot of foods that are high in solid fats, added sugars, or sodium. Maintain a healthy weight Body mass index (BMI) is a measurement that can be used to identify possible weight problems. It estimates body fat based on height and weight. Your health care provider can help determine your BMI and help you achieve or maintain a healthy  weight. Get regular exercise Get regular exercise. This is one of the most important things you can do for your health. Most adults should:  Exercise for at least 150 minutes each week. The exercise should increase your heart rate and make you sweat (moderate-intensity exercise).  Do strengthening exercises at least twice a week. This is in addition to the moderate-intensity exercise.  Spend less time sitting. Even light physical activity can be beneficial. Watch cholesterol and blood lipids Have your blood tested for lipids and cholesterol at 58 years of age, then have this test every 5 years. You may need to have your cholesterol levels checked more often if:  Your lipid or cholesterol levels are high.  You are older than 58 years of age.  You are at high risk for heart disease. What should I know about cancer screening? Many types of cancers can be detected early and may often be prevented. Depending on your health history and family history, you may need to have cancer screening at various ages. This may include screening for:  Colorectal cancer.  Prostate cancer.  Skin  cancer.  Lung cancer. What should I know about heart disease, diabetes, and high blood pressure? Blood pressure and heart disease  High blood pressure causes heart disease and increases the risk of stroke. This is more likely to develop in people who have high blood pressure readings, are of African descent, or are overweight.  Talk with your health care provider about your target blood pressure readings.  Have your blood pressure checked: ? Every 3-5 years if you are 48-60 years of age. ? Every year if you are 73 years old or older.  If you are between the ages of 31 and 57 and are a current or former smoker, ask your health care provider if you should have a one-time screening for abdominal aortic aneurysm (AAA). Diabetes Have regular diabetes screenings. This checks your fasting blood sugar level. Have  the screening done:  Once every three years after age 58 if you are at a normal weight and have a low risk for diabetes.  More often and at a younger age if you are overweight or have a high risk for diabetes. What should I know about preventing infection? Hepatitis B If you have a higher risk for hepatitis B, you should be screened for this virus. Talk with your health care provider to find out if you are at risk for hepatitis B infection. Hepatitis C Blood testing is recommended for:  Everyone born from [birth year] through 1964.  Anyone with known risk factors for hepatitis C. Sexually transmitted infections (STIs)  You should be screened each year for STIs, including gonorrhea and chlamydia, if: ? You are sexually active and are younger than 58 years of age. ? You are older than 58 years of age and your health care provider tells you that you are at risk for this type of infection. ? Your sexual activity has changed since you were last screened, and you are at increased risk for chlamydia or gonorrhea. Ask your health care provider if you are at risk.  Ask your health care provider about whether you are at high risk for HIV. Your health care provider may recommend a prescription medicine to help prevent HIV infection. If you choose to take medicine to prevent HIV, you should first get tested for HIV. You should then be tested every 3 months for as long as you are taking the medicine. Follow these instructions at home: Lifestyle  Do not use any products that contain nicotine or tobacco, such as cigarettes, e-cigarettes, and chewing tobacco. If you need help quitting, ask your health care provider.  Do not use street drugs.  Do not share needles.  Ask your health care provider for help if you need support or information about quitting drugs. Alcohol use  Do not drink alcohol if your health care provider tells you not to drink.  If you drink alcohol: ? Limit how much you have to 0-2  drinks a day. ? Be aware of how much alcohol is in your drink. In the [place] one drink equals one 12 oz bottle of beer (355 mL), one 5 oz glass of wine (148 mL), or one 1 oz glass of hard liquor (44 mL). General instructions  Schedule regular health, dental, and eye exams.  Stay current with your vaccines.  Tell your health care provider if: ? You often feel depressed. ? You have ever been abused or do not feel safe at home. Summary  Adopting a healthy lifestyle and getting preventive care are important in promoting health  and wellness.  Follow your health care provider's instructions about healthy diet, exercising, and getting tested or screened for diseases.  Follow your health care provider's instructions on monitoring your cholesterol and blood pressure. This information is not intended to replace advice given to you by your health care provider. Make sure you discuss any questions you have with your health care provider. Document Revised: 07/10/2018 Document Reviewed: 07/10/2018 Elsevier Patient Education  2020 Elsevier Inc.  Preventive Care 32-29 Years Old, Male Preventive care refers to lifestyle choices and visits with your health care provider that can promote health and wellness. This includes:  A yearly physical exam. This is also called an annual well check.  Regular dental and eye exams.  Immunizations.  Screening for certain conditions.  Healthy lifestyle choices, such as eating a healthy diet, getting regular exercise, not using drugs or products that contain nicotine and tobacco, and limiting alcohol use. What can I expect for my preventive care visit? Physical exam Your health care provider will check:  Height and weight. These may be used to calculate body mass index (BMI), which is a measurement that tells if you are at a healthy weight.  Heart rate and blood pressure.  Your skin for abnormal spots. Counseling Your health care provider may ask you  questions about:  Alcohol, tobacco, and drug use.  Emotional well-being.  Home and relationship well-being.  Sexual activity.  Eating habits.  Work and work Statistician. What immunizations do I need?  Influenza (flu) vaccine  This is recommended every year. Tetanus, diphtheria, and pertussis (Tdap) vaccine  You may need a Td booster every 10 years. Varicella (chickenpox) vaccine  You may need this vaccine if you have not already been vaccinated. Zoster (shingles) vaccine  You may need this after age 42. Measles, mumps, and rubella (MMR) vaccine  You may need at least one dose of MMR if you were born in 1957 or later. You may also need a second dose. Pneumococcal conjugate (PCV13) vaccine  You may need this if you have certain conditions and were not previously vaccinated. Pneumococcal polysaccharide (PPSV23) vaccine  You may need one or two doses if you smoke cigarettes or if you have certain conditions. Meningococcal conjugate (MenACWY) vaccine  You may need this if you have certain conditions. Hepatitis A vaccine  You may need this if you have certain conditions or if you travel or work in places where you may be exposed to hepatitis A. Hepatitis B vaccine  You may need this if you have certain conditions or if you travel or work in places where you may be exposed to hepatitis B. Haemophilus influenzae type b (Hib) vaccine  You may need this if you have certain risk factors. Human papillomavirus (HPV) vaccine  If recommended by your health care provider, you may need three doses over 6 months. You may receive vaccines as individual doses or as more than one vaccine together in one shot (combination vaccines). Talk with your health care provider about the risks and benefits of combination vaccines. What tests do I need? Blood tests  Lipid and cholesterol levels. These may be checked every 5 years, or more frequently if you are over 36 years old.  Hepatitis C  test.  Hepatitis B test. Screening  Lung cancer screening. You may have this screening every year starting at age 9 if you have a 30-pack-year history of smoking and currently smoke or have quit within the past 15 years.  Prostate cancer screening. Recommendations will  vary depending on your family history and other risks.  Colorectal cancer screening. All adults should have this screening starting at age 42 and continuing until age 31. Your health care provider may recommend screening at age 76 if you are at increased risk. You will have tests every 1-10 years, depending on your results and the type of screening test.  Diabetes screening. This is done by checking your blood sugar (glucose) after you have not eaten for a while (fasting). You may have this done every 1-3 years.  Sexually transmitted disease (STD) testing. Follow these instructions at home: Eating and drinking  Eat a diet that includes fresh fruits and vegetables, whole grains, lean protein, and low-fat dairy products.  Take vitamin and mineral supplements as recommended by your health care provider.  Do not drink alcohol if your health care provider tells you not to drink.  If you drink alcohol: ? Limit how much you have to 0-2 drinks a day. ? Be aware of how much alcohol is in your drink. In the [place] one drink equals one 12 oz bottle of beer (355 mL), one 5 oz glass of wine (148 mL), or one 1 oz glass of hard liquor (44 mL). Lifestyle  Take daily care of your teeth and gums.  Stay active. Exercise for at least 30 minutes on 5 or more days each week.  Do not use any products that contain nicotine or tobacco, such as cigarettes, e-cigarettes, and chewing tobacco. If you need help quitting, ask your health care provider.  If you are sexually active, practice safe sex. Use a condom or other form of protection to prevent STIs (sexually transmitted infections).  Talk with your health care provider about taking a  low-dose aspirin every day starting at age 25. What's next?  Go to your health care provider once a year for a well check visit.  Ask your health care provider how often you should have your eyes and teeth checked.  Stay up to date on all vaccines. This information is not intended to replace advice given to you by your health care provider. Make sure you discuss any questions you have with your health care provider. Document Revised: 07/11/2018 Document Reviewed: 07/11/2018 Elsevier Patient Education  [place].  Alcohol Abuse and Dependence Information, Adult Alcohol is a widely available drug. People drink alcohol in different amounts. People who drink alcohol very often and in large amounts often have problems during and after drinking. They may develop what is called an alcohol use disorder. There are two main types of alcohol use disorders:  Alcohol abuse. This is when you use alcohol too much or too often. You may use alcohol to make yourself feel happy or to reduce stress. You may have a hard time setting a limit on the amount you drink.  Alcohol dependence. This is when you use alcohol consistently for a period of time, and your body changes as a result. This can make it hard to stop drinking because you may start to feel sick or feel different when you do not use alcohol. These symptoms are known as withdrawal. How can alcohol abuse and dependence affect me? Alcohol abuse and dependence can have a negative effect on your life. Drinking too much can lead to addiction. You may feel like you need alcohol to function normally. You may drink alcohol before work in the morning, during the day, or as soon as you get home from work in the evening. These actions can  result in:  Poor work Systems analyst.  Job loss.  Financial problems.  Car crashes or criminal charges from driving after drinking alcohol.  Problems in your relationships with friends and family.  Losing the trust  and respect of coworkers, friends, and family. Drinking heavily over a long period of time can permanently damage your body and brain, and can cause lifelong health issues, such as:  Damage to your liver or pancreas.  Heart problems, high blood pressure, or stroke.  Certain cancers.  Decreased ability to fight infections.  Brain or nerve damage.  Depression.  Early (premature) death. If you are careless or you crave alcohol, it is easy to drink more than your body can handle (overdose). Alcohol overdose is a serious situation that requires hospitalization. It may lead to permanent injuries or death. What can increase my risk?  Having a family history of alcohol abuse.  Having depression or other mental health conditions.  Beginning to drink at an early age.  Binge drinking often.  Experiencing trauma, stress, and an unstable home life during childhood.  Spending time with people who drink often. What actions can I take to prevent or manage alcohol abuse and dependence?  Do not drink alcohol if: ? Your health care provider tells you not to drink. ? You are pregnant, may be pregnant, or are planning to become pregnant.  If you drink alcohol: ? Limit how much you use to:  0-1 drink a day for women.  0-2 drinks a day for men. ? Be aware of how much alcohol is in your drink. In the [place] one drink equals one 12 oz bottle of beer (355 mL), one 5 oz glass of wine (148 mL), or one 1 oz glass of hard liquor (44 mL).  Stop drinking if you have been drinking too much. This can be very hard to do if you are used to abusing alcohol. If you begin to have withdrawal symptoms, talk with your health care provider or a person that you trust. These symptoms may include anxiety, shaky hands, headache, nausea, sweating, or not being able to sleep.  Choose to drink nonalcoholic beverages in social gatherings and places where there may be alcohol. Activity  Spend more time on activities  that you enjoy that do not involve alcohol, like hobbies or exercise.  Find healthy ways to cope with stress, such as exercise, meditation, or spending time with people you care about. General information  Talk to your family, coworkers, and friends about supporting you in your efforts to stop drinking. If they drink, ask them not to drink around you. Spend more time with people who do not drink alcohol.  If you think that you have an alcohol dependency problem: ? Tell friends or family about your concerns. ? Talk with your health care provider or another health professional about where to get help. ? Work with a Transport planner and a Regulatory affairs officer. ? Consider joining a support group for people who struggle with alcohol abuse and dependence. Where to find support   Your health care provider.  SMART Recovery: www.smartrecovery.org Therapy and support groups  Local treatment centers or chemical dependency counselors.  Local AA groups in your community: NicTax.com.pt Where to find more information  Centers for Disease Control and Prevention: http://www.wolf.info/  National Institute on Alcohol Abuse and Alcoholism: http://www.bradshaw.com/  Alcoholics Anonymous (AA): [employer].com.pt Contact a health care provider if:  You drank more or for longer than you intended on more than one occasion.  You  tried to stop drinking or to cut back on how much you drink, but you were not able to.  You often drink to the point of vomiting or passing out.  You want to drink so badly that you cannot think about anything else.  You have problems in your life due to drinking, but you continue to drink.  You keep drinking even though you feel anxious, depressed, or have experienced memory loss.  You have stopped doing the things you used to enjoy in order to drink.  You have to drink more than you used to in order to get the effect you want.  You experience anxiety, sweating, nausea, shakiness, and trouble  sleeping when you try to stop drinking. Get help right away if:  You have thoughts about hurting yourself or others.  You have serious withdrawal symptoms, including: ? Confusion. ? Racing heart. ? High blood pressure. ? Fever. If you ever feel like you may hurt yourself or others, or have thoughts about taking your own life, get help right away. You can go to your nearest emergency department or call:  Your local emergency services (911 in the [place]  A suicide crisis helpline, such as the [facility] at (571) 295-8522. This is open 24 hours a day. Summary  Alcohol abuse and dependence can have a negative effect on your life. Drinking too much or too often can lead to addiction.  If you drink alcohol, limit how much you use.  If you are having trouble keeping your drinking under control, find ways to change your behavior. Hobbies, calming activities, exercise, or support groups can help.  If you feel you need help with changing your drinking habits, talk with your health care provider, a good friend, or a therapist, or go to an [place] group. This information is not intended to replace advice given to you by your health care provider. Make sure you discuss any questions you have with your health care provider. Document Revised: 11/05/2018 Document Reviewed: 09/24/2018 Elsevier Patient Education  [place].

## 2020-05-10 ENCOUNTER — Ambulatory Visit
Admission: RE | Admit: 2020-05-10 | Discharge: 2020-05-10 | Disposition: A | Discharge status: Home / Self Care | Payer: BC Managed Care – PPO | Source: Ambulatory Visit | Attending: Family Medicine | Admitting: Family Medicine

## 2020-05-10 DIAGNOSIS — E041 Nontoxic single thyroid nodule: Secondary | ICD-10-CM

## 2020-05-10 NOTE — Addendum Note (Signed)
Addended by: Jon Billings on: 05/10/2020 04:41 PM   Modules accepted: Orders

## 2020-05-25 ENCOUNTER — Ambulatory Visit
Admission: RE | Admit: 2020-05-25 | Discharge: 2020-05-25 | Disposition: A | Discharge status: Home / Self Care | Payer: BC Managed Care – PPO | Source: Ambulatory Visit | Attending: Family Medicine | Admitting: Family Medicine

## 2020-05-25 ENCOUNTER — Other Ambulatory Visit (HOSPITAL_COMMUNITY)
Admission: RE | Admit: 2020-05-25 | Discharge: 2020-05-25 | Disposition: A | Discharge status: Home / Self Care | Payer: BC Managed Care – PPO | Source: Ambulatory Visit | Attending: Radiology | Admitting: Radiology

## 2020-05-25 DIAGNOSIS — D34 Benign neoplasm of thyroid gland: Secondary | ICD-10-CM | POA: Insufficient documentation

## 2020-05-25 DIAGNOSIS — E041 Nontoxic single thyroid nodule: Secondary | ICD-10-CM

## 2020-05-26 LAB — CYTOLOGY - NON PAP

## 2020-10-16 ENCOUNTER — Other Ambulatory Visit: Payer: Self-pay | Admitting: Cardiovascular Disease

## 2020-10-18 ENCOUNTER — Other Ambulatory Visit: Payer: Self-pay | Admitting: *Deleted

## 2020-10-18 NOTE — Telephone Encounter (Signed)
Received a fax for pt's Losartan, from [facility], stating that all strength's are on back order.  Please advise!

## 2020-10-18 NOTE — Telephone Encounter (Signed)
See previous phone note.  This has been addressed.  Pt is going to call next week if still not available at [facility] and have [place] send in prescription to [facility].

## 2020-10-18 NOTE — Telephone Encounter (Signed)
Spoke with pt and made him aware that I have called [facility] on [address] and they have Losartan 50mg  in stock. Advised I could send new prescription there or we would have to change his medication to a different one.  Pt states he would like to hold off for a week to see if [facility] gets it in stock.  If they don't, he will call back to have [place] send prescription to [facility].

## 2020-11-09 ENCOUNTER — Encounter: Payer: Self-pay | Admitting: Cardiovascular Disease
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
## 2020-11-10 ENCOUNTER — Encounter: Payer: Self-pay | Admitting: Cardiovascular Disease

## 2020-11-10 ENCOUNTER — Ambulatory Visit: Payer: BC Managed Care – PPO | Admitting: Cardiovascular Disease

## 2020-11-10 ENCOUNTER — Other Ambulatory Visit: Payer: Self-pay

## 2020-11-10 VITALS — BP 120/82 | HR 75 | Ht 72.0 in | Wt 163.4 lb

## 2020-11-10 DIAGNOSIS — I471 Supraventricular tachycardia: Secondary | ICD-10-CM

## 2020-11-10 LAB — BASIC METABOLIC PANEL
BUN/Creatinine Ratio: 8 — ABNORMAL LOW (ref 9–20)
BUN: 8 mg/dL (ref 6–24)
CO2: 23 mmol/L (ref 20–29)
Calcium: 9.8 mg/dL (ref 8.7–10.2)
Chloride: 94 mmol/L — ABNORMAL LOW (ref 96–106)
Creatinine, Ser: 0.99 mg/dL (ref 0.76–1.27)
Glucose: 96 mg/dL (ref 65–99)
Potassium: 4.9 mmol/L (ref 3.5–5.2)
Sodium: 133 mmol/L — ABNORMAL LOW (ref 134–144)
eGFR: 88 mL/min/{1.73_m2} (ref >59)

## 2020-11-10 LAB — LIPID PANEL
Chol/HDL Ratio: 2.1 ratio (ref 0.0–5.0)
Cholesterol, Total: 152 mg/dL (ref 100–199)
HDL: 74 mg/dL (ref >39)
LDL Chol Calc (NIH): 68 mg/dL (ref 0–99)
Triglycerides: 44 mg/dL (ref 0–149)
VLDL Cholesterol Cal: 10 mg/dL (ref 5–40)

## 2020-11-10 LAB — ALT: ALT: 21 IU/L (ref 0–44)

## 2020-11-10 MED ORDER — LOSARTAN POTASSIUM 50 MG PO TABS: 50.0000 mg | ORAL_TABLET | Freq: Every day | ORAL | 3 refills | Status: DC

## 2020-11-10 MED ORDER — ASPIRIN EC 81 MG PO TBEC
81.0000 mg | DELAYED_RELEASE_TABLET | Freq: Every day | ORAL | 3 refills | Status: AC
Start: 2020-11-10 — End: ?

## 2020-11-10 NOTE — Patient Instructions (Signed)
Medication Instructions:  Your physician has recommended you make the following change in your medication:   START Aspirin 81mg  daily   *If you need a refill on your cardiac medications before your next appointment, please call your pharmacy*   Lab Work: TODAY: Lipids, bmet, alt  If you have labs (blood work) drawn today and your tests are completely normal, you will receive your results only by: Marland Kitchen MyChart Message (if you have MyChart) OR . A paper copy in the mail If you have any lab test that is abnormal or we need to change your treatment, we will call you to review the results.   Testing/Procedures: none   Follow-Up: At [facility], you and your health needs are our priority.  As part of our continuing mission to provide you with exceptional heart care, we have created designated Provider Care Teams.  These Care Teams include your primary Cardiologist (physician) and Advanced Practice Providers (APPs -  Physician Assistants and Nurse Practitioners) who all work together to provide you with the care you need, when you need it.  Your next appointment:   1 year(s)  The format for your next appointment:   In Person  Provider:   You may see Mertie Moores, MD or one of the following Advanced Practice Providers on your designated Care Team:    Richardson Dopp, PA-C  [place], [place]

## 2021-02-28 ENCOUNTER — Other Ambulatory Visit: Payer: Self-pay | Admitting: *Deleted

## 2021-02-28 DIAGNOSIS — F17210 Nicotine dependence, cigarettes, uncomplicated: Secondary | ICD-10-CM

## 2021-03-23 ENCOUNTER — Ambulatory Visit
Admission: RE | Admit: 2021-03-23 | Discharge: 2021-03-23 | Disposition: A | Discharge status: Home / Self Care | Payer: BC Managed Care – PPO | Source: Ambulatory Visit | Attending: Acute Care | Admitting: Acute Care

## 2021-03-23 ENCOUNTER — Other Ambulatory Visit: Payer: Self-pay

## 2021-03-23 DIAGNOSIS — F17210 Nicotine dependence, cigarettes, uncomplicated: Secondary | ICD-10-CM

## 2021-04-04 NOTE — Progress Notes (Signed)
Please call patient and let them  know their  low dose Ct was read as a Lung RADS 2: nodules that are benign in appearance and behavior with a very low likelihood of becoming a clinically active cancer due to size or lack of growth. Recommendation per radiology is for a repeat LDCT in 12 months. .Please let them  know we will order and schedule their  annual screening scan for 02/2022. Please let them  know there was notation of CAD on their  scan.  Please remind the patient  that this is a non-gated exam therefore degree or severity of disease  cannot be determined. Please have them  follow up with their PCP regarding potential risk factor modification, dietary therapy or pharmacologic therapy if clinically indicated. Pt.  is  currently not on statin therapy. Please place order for annual  screening scan for  02/2022 and fax results to PCP. Thanks so much.  Please let patient know there was notation of Left anterior descending and right coronary atherosclerosis. Mildly atherosclerotic nonaneurysmal thoracic aorta. He is not currently on statin that I can see in Epic. Please have him follow up with PCP.

## 2021-04-05 ENCOUNTER — Other Ambulatory Visit: Payer: Self-pay | Admitting: *Deleted

## 2021-04-05 DIAGNOSIS — F17210 Nicotine dependence, cigarettes, uncomplicated: Secondary | ICD-10-CM

## 2021-05-26 ENCOUNTER — Other Ambulatory Visit: Payer: Self-pay

## 2021-05-26 ENCOUNTER — Encounter: Payer: Self-pay | Admitting: Family Medicine

## 2021-05-26 ENCOUNTER — Ambulatory Visit (INDEPENDENT_AMBULATORY_CARE_PROVIDER_SITE_OTHER): Payer: BC Managed Care – PPO | Admitting: Family Medicine

## 2021-05-26 VITALS — BP 124/76 | HR 93 | Temp 97.0°F | Ht 72.0 in | Wt 178.4 lb

## 2021-05-26 DIAGNOSIS — I459 Conduction disorder, unspecified: Secondary | ICD-10-CM | POA: Diagnosis not present

## 2021-05-26 DIAGNOSIS — Z789 Other specified health status: Secondary | ICD-10-CM | POA: Diagnosis not present

## 2021-05-26 DIAGNOSIS — I2510 Atherosclerotic heart disease of native coronary artery without angina pectoris: Secondary | ICD-10-CM

## 2021-05-26 DIAGNOSIS — Z720 Tobacco use: Secondary | ICD-10-CM | POA: Diagnosis not present

## 2021-05-26 DIAGNOSIS — I2584 Coronary atherosclerosis due to calcified coronary lesion: Secondary | ICD-10-CM | POA: Diagnosis not present

## 2021-05-26 DIAGNOSIS — Z0000 Encounter for general adult medical examination without abnormal findings: Secondary | ICD-10-CM

## 2021-05-26 DIAGNOSIS — Z23 Encounter for immunization: Secondary | ICD-10-CM | POA: Diagnosis not present

## 2021-05-26 DIAGNOSIS — I10 Essential (primary) hypertension: Secondary | ICD-10-CM

## 2021-05-26 LAB — LIPID PANEL
Cholesterol: 171 mg/dL (ref 0–200)
HDL: 57.8 mg/dL (ref >39.00)
LDL Cholesterol: 88 mg/dL (ref 0–99)
NonHDL: 112.7
Total CHOL/HDL Ratio: 3
Triglycerides: 123 mg/dL (ref 0.0–149.0)
VLDL: 24.6 mg/dL (ref 0.0–40.0)

## 2021-05-26 LAB — COMPREHENSIVE METABOLIC PANEL
ALT: 29 U/L (ref 0–53)
AST: 23 U/L (ref 0–37)
Albumin: 4.6 g/dL (ref 3.5–5.2)
Alkaline Phosphatase: 84 U/L (ref 39–117)
BUN: 13 mg/dL (ref 6–23)
CO2: 29 mEq/L (ref 19–32)
Calcium: 9.5 mg/dL (ref 8.4–10.5)
Chloride: 92 mEq/L — ABNORMAL LOW (ref 96–112)
Creatinine, Ser: 0.93 mg/dL (ref 0.40–1.50)
GFR: 90.12 mL/min (ref >60.00)
Glucose, Bld: 92 mg/dL (ref 70–99)
Potassium: 4.4 mEq/L (ref 3.5–5.1)
Sodium: 128 mEq/L — ABNORMAL LOW (ref 135–145)
Total Bilirubin: 0.4 mg/dL (ref 0.2–1.2)
Total Protein: 7.1 g/dL (ref 6.0–8.3)

## 2021-05-26 LAB — URINALYSIS, ROUTINE W REFLEX MICROSCOPIC
Bilirubin Urine: NEGATIVE
Hgb urine dipstick: NEGATIVE
Ketones, ur: NEGATIVE
Leukocytes,Ua: NEGATIVE
Nitrite: NEGATIVE
Specific Gravity, Urine: 1.02 (ref 1.000–1.030)
Total Protein, Urine: NEGATIVE
Urine Glucose: NEGATIVE
Urobilinogen, UA: 0.2 (ref 0.0–1.0)
pH: 6 (ref 5.0–8.0)

## 2021-05-26 LAB — CBC
HCT: 44.1 % (ref 39.0–52.0)
Hemoglobin: 14.7 g/dL (ref 13.0–17.0)
MCHC: 33.3 g/dL (ref 30.0–36.0)
MCV: 97.9 fl (ref 78.0–100.0)
Platelets: 379 10*3/uL (ref 150.0–400.0)
RBC: 4.5 Mil/uL (ref 4.22–5.81)
RDW: 13.6 % (ref 11.5–15.5)
WBC: 13.6 10*3/uL — ABNORMAL HIGH (ref 4.0–10.5)

## 2021-05-26 LAB — PSA: PSA: 0.57 ng/mL (ref 0.10–4.00)
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
## 2021-05-30 NOTE — Progress Notes (Signed)
Please schedule for virtual to discuss hyponatremia.

## 2021-05-31 ENCOUNTER — Telehealth: Payer: Self-pay | Admitting: Family Medicine

## 2021-05-31 NOTE — Telephone Encounter (Signed)
Returned patients call informed patient that Dr. Ethelene Hal was requesting a virtual visit to discuss labs. Per patient he would like to view his labs via Mychart then give [place] a call back to schedule appointment.

## 2021-10-03 ENCOUNTER — Other Ambulatory Visit: Payer: Self-pay

## 2021-10-04 ENCOUNTER — Ambulatory Visit: Payer: BC Managed Care – PPO | Admitting: Family Medicine

## 2021-10-04 ENCOUNTER — Encounter: Payer: Self-pay | Admitting: Family Medicine

## 2021-10-04 VITALS — BP 138/70 | HR 87 | Temp 97.8°F | Ht 72.0 in | Wt 168.8 lb

## 2021-10-04 DIAGNOSIS — I10 Essential (primary) hypertension: Secondary | ICD-10-CM

## 2021-10-04 DIAGNOSIS — F418 Other specified anxiety disorders: Secondary | ICD-10-CM

## 2021-10-04 DIAGNOSIS — E871 Hypo-osmolality and hyponatremia: Secondary | ICD-10-CM

## 2021-10-04 DIAGNOSIS — Z789 Other specified health status: Secondary | ICD-10-CM | POA: Diagnosis not present

## 2021-10-04 DIAGNOSIS — I2584 Coronary atherosclerosis due to calcified coronary lesion: Secondary | ICD-10-CM

## 2021-10-04 DIAGNOSIS — I2510 Atherosclerotic heart disease of native coronary artery without angina pectoris: Secondary | ICD-10-CM

## 2021-10-04 DIAGNOSIS — F1090 Alcohol use, unspecified, uncomplicated: Secondary | ICD-10-CM

## 2021-10-04 LAB — BASIC METABOLIC PANEL
BUN: 8 mg/dL (ref 6–23)
CO2: 28 mEq/L (ref 19–32)
Calcium: 9.7 mg/dL (ref 8.4–10.5)
Chloride: 92 mEq/L — ABNORMAL LOW (ref 96–112)
Creatinine, Ser: 0.97 mg/dL (ref 0.40–1.50)
GFR: 85.46 mL/min (ref >60.00)
Glucose, Bld: 128 mg/dL — ABNORMAL HIGH (ref 70–99)
Potassium: 4.4 mEq/L (ref 3.5–5.1)
Sodium: 126 mEq/L — ABNORMAL LOW (ref 135–145)

## 2021-10-04 LAB — HEPATIC FUNCTION PANEL
ALT: 26 U/L (ref 0–53)
AST: 24 U/L (ref 0–37)
Albumin: 4.8 g/dL (ref 3.5–5.2)
Alkaline Phosphatase: 85 U/L (ref 39–117)
Bilirubin, Direct: 0.1 mg/dL (ref 0.0–0.3)
Total Bilirubin: 0.6 mg/dL (ref 0.2–1.2)
Total Protein: 7.2 g/dL (ref 6.0–8.3)

## 2021-10-04 MED ORDER — ATORVASTATIN CALCIUM 10 MG PO TABS: 10.0000 mg | ORAL_TABLET | Freq: Every day | ORAL | 3 refills | Status: DC

## 2021-10-04 MED ORDER — SERTRALINE HCL 50 MG PO TABS: 50.0000 mg | ORAL_TABLET | Freq: Every day | ORAL | 0 refills | Status: DC
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
## 2021-10-06 LAB — SODIUM, URINE, RANDOM: Sodium, Ur: 26 mmol/L — ABNORMAL LOW (ref 28–272)

## 2021-10-06 LAB — OSMOLALITY, URINE: Osmolality, Ur: 627 mOsm/kg (ref 50–1200)

## 2021-10-06 LAB — OSMOLALITY: Osmolality: 301 mOsm/kg (ref 278–305)

## 2021-11-15 ENCOUNTER — Ambulatory Visit: Payer: BC Managed Care – PPO | Admitting: Family Medicine

## 2021-12-30 ENCOUNTER — Other Ambulatory Visit: Payer: Self-pay | Admitting: Family Medicine

## 2021-12-30 DIAGNOSIS — F418 Other specified anxiety disorders: Secondary | ICD-10-CM

## 2022-01-06 ENCOUNTER — Ambulatory Visit: Payer: BC Managed Care – PPO | Admitting: Cardiovascular Disease

## 2022-01-06 ENCOUNTER — Encounter: Payer: Self-pay | Admitting: Cardiovascular Disease

## 2022-01-06 VITALS — BP 100/64 | HR 82 | Ht 72.0 in | Wt 163.0 lb

## 2022-01-06 DIAGNOSIS — E782 Mixed hyperlipidemia: Secondary | ICD-10-CM | POA: Diagnosis not present

## 2022-01-06 DIAGNOSIS — I471 Supraventricular tachycardia, unspecified: Secondary | ICD-10-CM

## 2022-01-06 DIAGNOSIS — J449 Chronic obstructive pulmonary disease, unspecified: Secondary | ICD-10-CM

## 2022-01-06 NOTE — Patient Instructions (Signed)
Medication Instructions:  Your physician recommends that you continue on your current medications as directed. Please refer to the Current Medication list given to you today.  *If you need a refill on your cardiac medications before your next appointment, please call your pharmacy*   Lab Work: NONE If you have labs (blood work) drawn today and your tests are completely normal, you will receive your results only by: MyChart Message (if you have MyChart) OR A paper copy in the mail If you have any lab test that is abnormal or we need to change your treatment, we will call you to review the results.   Testing/Procedures: NONE   Follow-Up: At [facility], you and your health needs are our priority.  As part of our continuing mission to provide you with exceptional heart care, we have created designated Provider Care Teams.  These Care Teams include your primary Cardiologist (physician) and Advanced Practice Providers (APPs -  Physician Assistants and Nurse Practitioners) who all work together to provide you with the care you need, when you need it.  Your next appointment:   1 year(s)  The format for your next appointment:   In Person  Provider:   Swinyer, Weaver, or Nahser {     Important Information About Sugar       
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
## 2022-01-19 ENCOUNTER — Other Ambulatory Visit: Payer: Self-pay | Admitting: Cardiovascular Disease

## 2022-02-07 ENCOUNTER — Telehealth: Payer: Self-pay | Admitting: Cardiovascular Disease

## 2022-02-07 NOTE — Telephone Encounter (Signed)
Called and spoke with patient who states he got tired of waiting in the parking lot. Pt is now at home. I don't see that any labs were ordered or needed to be ordered. Patient states he will be seeing his PCP next month for his annual physical and will get them done then. Apologized to patient for waiting and not getting an answer sooner as we were in clinic.

## 2022-02-07 NOTE — Telephone Encounter (Signed)
Pt is calling in regards to labs. He states that after his appts he usually has labs ordered. He is wanting to know if he can have them ordered again. He states he is waiting in the lab parking lot now and would like a call back to verify that he can go in for said labs. Please advise.

## 2022-03-23 ENCOUNTER — Ambulatory Visit
Admission: RE | Admit: 2022-03-23 | Discharge: 2022-03-23 | Disposition: A | Discharge status: Home / Self Care | Payer: BC Managed Care – PPO | Source: Ambulatory Visit | Attending: Family Medicine | Admitting: Family Medicine

## 2022-03-23 DIAGNOSIS — F17210 Nicotine dependence, cigarettes, uncomplicated: Secondary | ICD-10-CM

## 2022-03-27 ENCOUNTER — Other Ambulatory Visit: Payer: Self-pay

## 2022-03-27 DIAGNOSIS — F17210 Nicotine dependence, cigarettes, uncomplicated: Secondary | ICD-10-CM

## 2022-03-27 DIAGNOSIS — Z87891 Personal history of nicotine dependence: Secondary | ICD-10-CM

## 2022-03-27 DIAGNOSIS — Z122 Encounter for screening for malignant neoplasm of respiratory organs: Secondary | ICD-10-CM

## 2022-03-31 ENCOUNTER — Other Ambulatory Visit: Payer: Self-pay | Admitting: Family Medicine

## 2022-03-31 DIAGNOSIS — F418 Other specified anxiety disorders: Secondary | ICD-10-CM

## 2022-05-29 ENCOUNTER — Encounter: Payer: Self-pay | Admitting: Family Medicine

## 2022-05-29 ENCOUNTER — Ambulatory Visit (INDEPENDENT_AMBULATORY_CARE_PROVIDER_SITE_OTHER): Payer: BC Managed Care – PPO | Admitting: Family Medicine

## 2022-05-29 VITALS — BP 136/74 | HR 62 | Temp 97.3°F | Resp 18 | Ht 73.0 in | Wt 166.6 lb

## 2022-05-29 DIAGNOSIS — Z789 Other specified health status: Secondary | ICD-10-CM | POA: Diagnosis not present

## 2022-05-29 DIAGNOSIS — Z0000 Encounter for general adult medical examination without abnormal findings: Secondary | ICD-10-CM | POA: Diagnosis not present

## 2022-05-29 DIAGNOSIS — Z23 Encounter for immunization: Secondary | ICD-10-CM | POA: Diagnosis not present

## 2022-05-29 DIAGNOSIS — I10 Essential (primary) hypertension: Secondary | ICD-10-CM | POA: Diagnosis not present

## 2022-05-29 DIAGNOSIS — F418 Other specified anxiety disorders: Secondary | ICD-10-CM

## 2022-05-29 DIAGNOSIS — I2510 Atherosclerotic heart disease of native coronary artery without angina pectoris: Secondary | ICD-10-CM | POA: Diagnosis not present

## 2022-05-29 DIAGNOSIS — I2584 Coronary atherosclerosis due to calcified coronary lesion: Secondary | ICD-10-CM

## 2022-05-29 LAB — URINALYSIS, ROUTINE W REFLEX MICROSCOPIC
Bilirubin Urine: NEGATIVE
Ketones, ur: NEGATIVE
Leukocytes,Ua: NEGATIVE
Nitrite: NEGATIVE
Specific Gravity, Urine: 1.02 (ref 1.000–1.030)
Total Protein, Urine: NEGATIVE
Urine Glucose: NEGATIVE
Urobilinogen, UA: 0.2 (ref 0.0–1.0)
WBC, UA: NONE SEEN (ref 0–?)
pH: 6 (ref 5.0–8.0)

## 2022-05-29 LAB — CBC
HCT: 45.6 % (ref 39.0–52.0)
Hemoglobin: 15.2 g/dL (ref 13.0–17.0)
MCHC: 33.4 g/dL (ref 30.0–36.0)
MCV: 98.6 fl (ref 78.0–100.0)
Platelets: 209 10*3/uL (ref 150.0–400.0)
RBC: 4.62 Mil/uL (ref 4.22–5.81)
RDW: 13.8 % (ref 11.5–15.5)
WBC: 10.1 10*3/uL (ref 4.0–10.5)

## 2022-05-29 LAB — LIPID PANEL
Cholesterol: 136 mg/dL (ref 0–200)
HDL: 57.1 mg/dL (ref >39.00)
LDL Cholesterol: 60 mg/dL (ref 0–99)
NonHDL: 79.02
Total CHOL/HDL Ratio: 2
Triglycerides: 96 mg/dL (ref 0.0–149.0)
VLDL: 19.2 mg/dL (ref 0.0–40.0)

## 2022-05-29 LAB — COMPREHENSIVE METABOLIC PANEL
ALT: 15 U/L (ref 0–53)
AST: 19 U/L (ref 0–37)
Albumin: 4.5 g/dL (ref 3.5–5.2)
Alkaline Phosphatase: 90 U/L (ref 39–117)
BUN: 9 mg/dL (ref 6–23)
CO2: 28 mEq/L (ref 19–32)
Calcium: 9.7 mg/dL (ref 8.4–10.5)
Chloride: 102 mEq/L (ref 96–112)
Creatinine, Ser: 1.02 mg/dL (ref 0.40–1.50)
GFR: 80.09 mL/min (ref >60.00)
Glucose, Bld: 99 mg/dL (ref 70–99)
Potassium: 4.3 mEq/L (ref 3.5–5.1)
Sodium: 137 mEq/L (ref 135–145)
Total Bilirubin: 0.6 mg/dL (ref 0.2–1.2)
Total Protein: 6.5 g/dL (ref 6.0–8.3)

## 2022-05-29 LAB — PSA: PSA: 0.41 ng/mL (ref 0.10–4.00)

## 2022-05-29 MED ORDER — SERTRALINE HCL 50 MG PO TABS: 50.0000 mg | ORAL_TABLET | Freq: Every day | ORAL | 1 refills | Status: DC

## 2022-05-29 MED ORDER — ATORVASTATIN CALCIUM 10 MG PO TABS: 10.0000 mg | ORAL_TABLET | Freq: Every day | ORAL | 3 refills | Status: DC

## 2022-05-29 NOTE — Progress Notes (Signed)
Established Patient Office Visit  Subjective   Patient ID: Edward Bishop, male    DOB: January 20, 1961  Age: 61 y.o. MRN: [id number]  Chief Complaint  Patient presents with   Annual Exam    CPE pt is fasting pt would like both shingles and flu vaccines today. Pt delcoe PHQ 9 and GAD 7.     HPI for physical exam.  He is fasting today.  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  He has regular dental care.  Has no issues with urine flow.  Moving his bowels regularly without issue.  Continues to smoke about a pack a day but is currently trying to quit.  Blood pressure is well controlled.  He forgot to take his medication this morning.  He is cut back on his drinking significantly.  He drinks only rare occasion.  Sertraline is helping.    Review of Systems  Constitutional: Negative.   HENT: Negative.    Eyes:  Negative for blurred vision, discharge and redness.  Respiratory: Negative.    Cardiovascular: Negative.   Gastrointestinal:  Negative for abdominal pain.  Genitourinary: Negative.   Musculoskeletal: Negative.  Negative for myalgias.  Skin:  Negative for rash.  Neurological:  Negative for tingling, loss of consciousness and weakness.  Endo/Heme/Allergies:  Negative for polydipsia.  Psychiatric/Behavioral:  Negative for depression. The patient is not nervous/anxious.       Objective:     BP 136/74 (BP Location: Left Arm, Cuff Size: Normal)   Pulse 62   Temp (!) 97.3 F (36.3 C) (Temporal)   Resp 18   Ht '6\' 1"'$  (1.854 m)   Wt 166 lb 9.6 oz (75.6 kg)   SpO2 96%   BMI 21.98 kg/m  BP Readings from Last 3 Encounters:  05/29/22 136/74  01/06/22 100/64  10/04/21 138/70   Wt Readings from Last 3 Encounters:  05/29/22 166 lb 9.6 oz (75.6 kg)  01/06/22 163 lb (73.9 kg)  10/04/21 168 lb 12.5 oz (76.6 kg)      Physical Exam Constitutional:      General: He is not in acute distress.    Appearance: Normal appearance.  He is not ill-appearing, toxic-appearing or diaphoretic.  HENT:     Head: Normocephalic and atraumatic.     Right Ear: External ear normal.     Left Ear: External ear normal.     Mouth/Throat:     Mouth: Mucous membranes are moist.     Pharynx: Oropharynx is clear. No oropharyngeal exudate or posterior oropharyngeal erythema.  Eyes:     General: No scleral icterus.       Right eye: No discharge.        Left eye: No discharge.     Extraocular Movements: Extraocular movements intact.     Conjunctiva/sclera: Conjunctivae normal.     Pupils: Pupils are equal, round, and reactive to light.  Cardiovascular:     Rate and Rhythm: Normal rate and regular rhythm.  Pulmonary:     Effort: Pulmonary effort is normal. No respiratory distress.     Breath sounds: Normal breath sounds.  Abdominal:     General: Bowel sounds are normal. There is no distension.     Tenderness: There is no abdominal tenderness. There is no guarding.  Musculoskeletal:     Cervical back: No rigidity or tenderness.  Skin:    General: Skin is warm and dry.  Neurological:     Mental Status: He  is alert and oriented to person, place, and time.  Psychiatric:        Mood and Affect: Mood normal.        Behavior: Behavior normal.      No results found for any visits on 05/29/22.    The 10-year ASCVD risk score (Arnett DK, et al., 2019) is: 13.8%    Assessment & Plan:   Problem List Items Addressed This Visit       Cardiovascular and Mediastinum   Essential hypertension   Relevant Medications   atorvastatin (LIPITOR) 10 MG tablet   Other Relevant Orders   CBC   Comprehensive metabolic panel   Urinalysis, Routine w reflex microscopic   Coronary artery calcification   Relevant Medications   atorvastatin (LIPITOR) 10 MG tablet     Other   Healthcare maintenance - Primary   Relevant Orders   Lipid panel   PSA   Alcohol use   Other Visit Diagnoses     Need for influenza vaccination       Relevant  Orders   Flu Vaccine QUAD 6+ mos PF IM (Fluarix Quad PF) (Completed)   Need for shingles vaccine       Relevant Orders   Varicella-zoster vaccine IM (Completed)   Depression with anxiety       Relevant Medications   sertraline (ZOLOFT) 50 MG tablet       Return in about 6 months (around 11/28/2022).  Check and record blood pressures periodically.  Continue active lifestyle.  Discussed strategies to stop smoking.  He is currently trying.  He is weaning down cigarette smoke daily and using Nicorette lozenges.  Return in 6 months for follow-up and second shingles vaccine.  Continue losartan 50 mg daily.  Continue sertraline 50 mg daily.  Flu and Shingrix vaccines today.  Continue atorvastatin for history of coronary artery calcification.  Second Shingrix vaccine in 6 months.  Libby Maw, MD

## 2022-10-11 IMAGING — CT CT CHEST LUNG CANCER SCREENING LOW DOSE W/O CM
1 series · 15 of 33 positions shown, 19 images · non-contrast
Comparison: 02/23/2020 screening chest CT.

CLINICAL DATA: 59-year-old asymptomatic male current smoker with 61
pack-year smoking history.

EXAM:
CT CHEST WITHOUT CONTRAST LOW-DOSE FOR LUNG CANCER SCREENING
TECHNIQUE: Multidetector CT imaging of the chest was performed following the
standard protocol without IV contrast.

[Series 2: ldct screening <30 bmi · axial · 0.72mm/px · z∈[-342,-37]mm · 15 of 73 slices shown, 19 images]
[im 6/73  mediastinal]
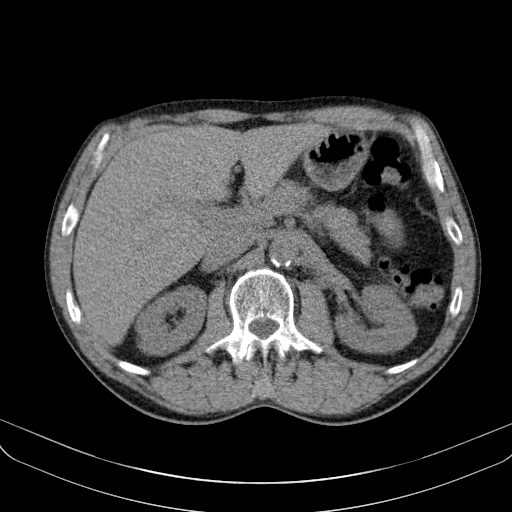
[im 6/73  lung]
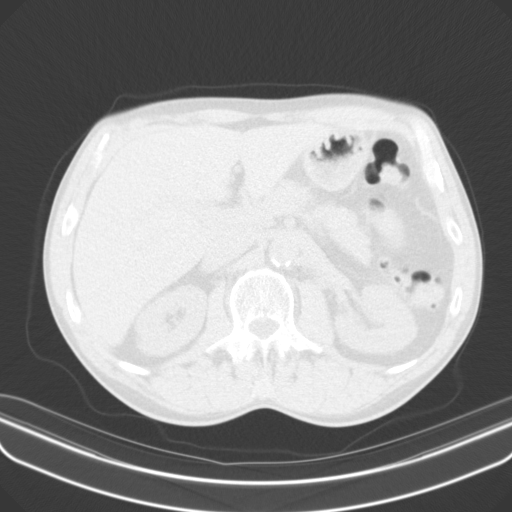
[im 11/73  lung]
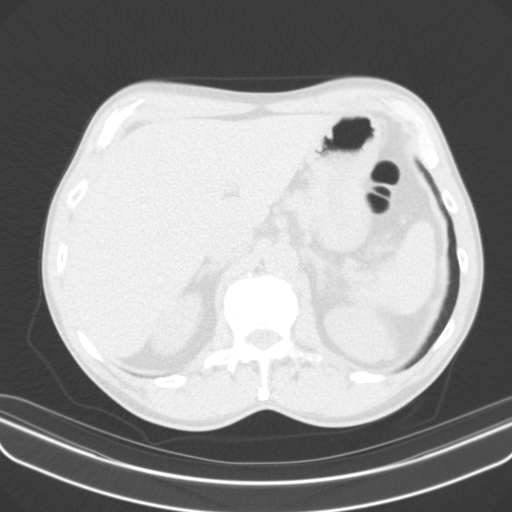
[im 15/73  lung]
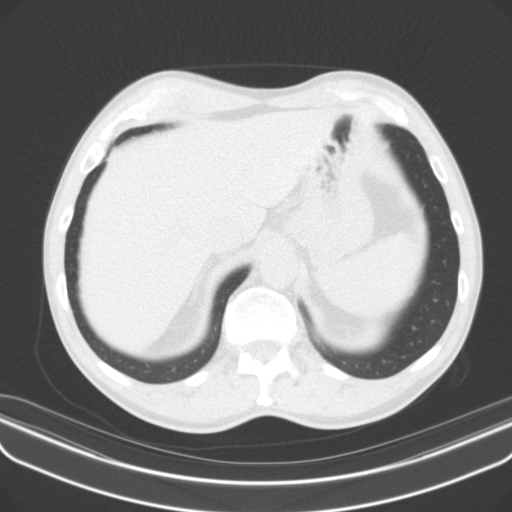
[im 19/73  lung]
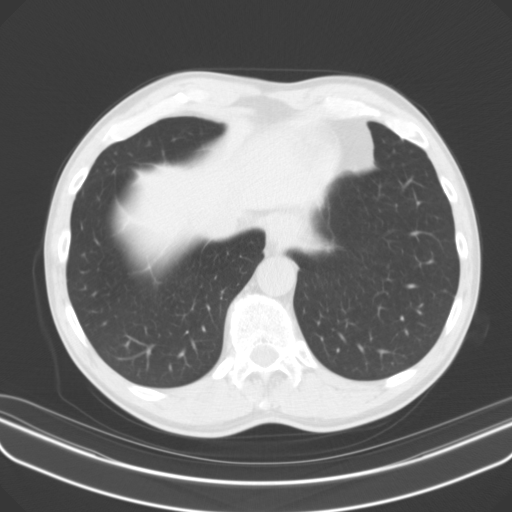
[im 25/73  mediastinal]
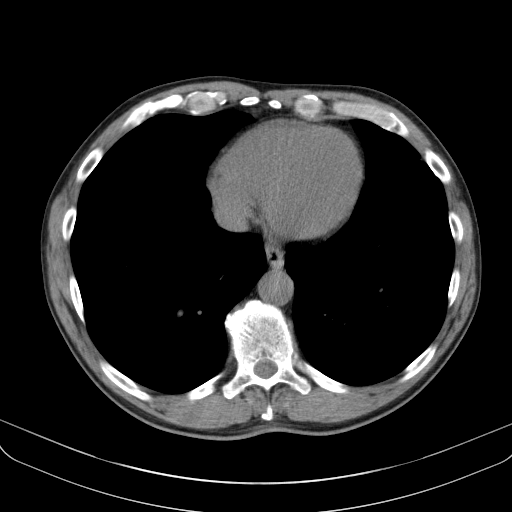
[im 25/73  lung]
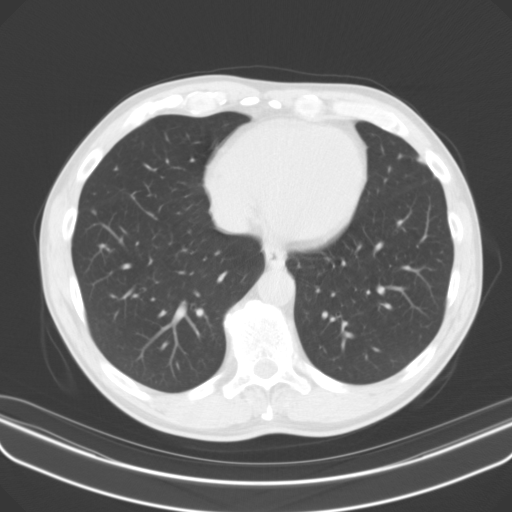
[im 29/73  lung]
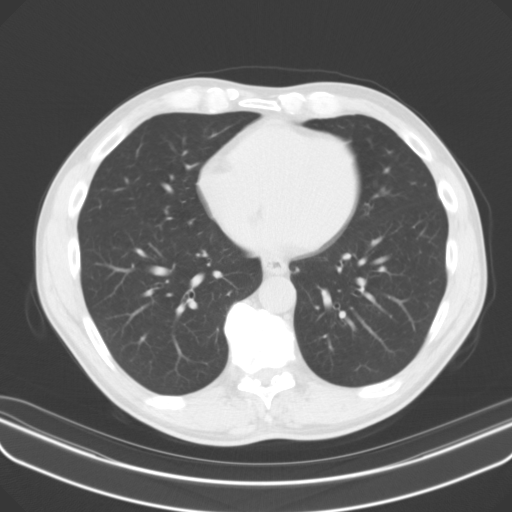
[im 33/73  lung]
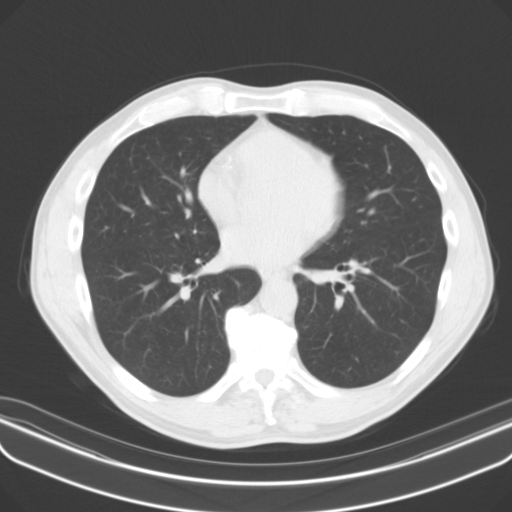
[im 38/73  lung]
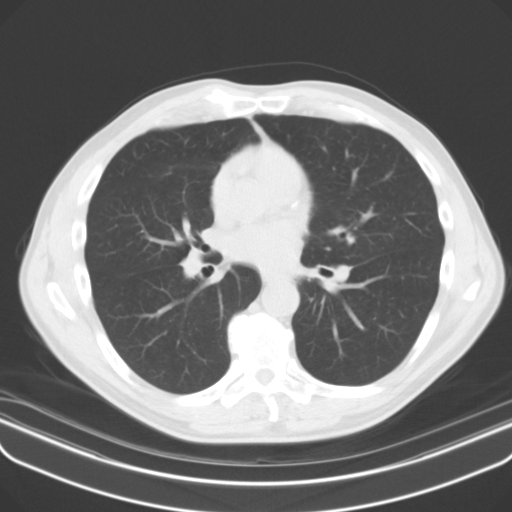
[im 41/73  mediastinal]
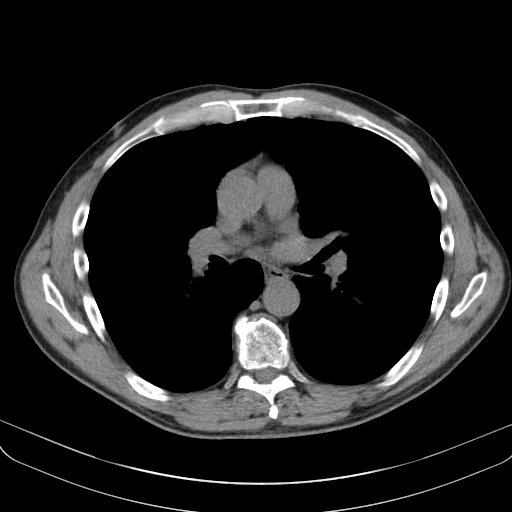
[im 41/73  lung]
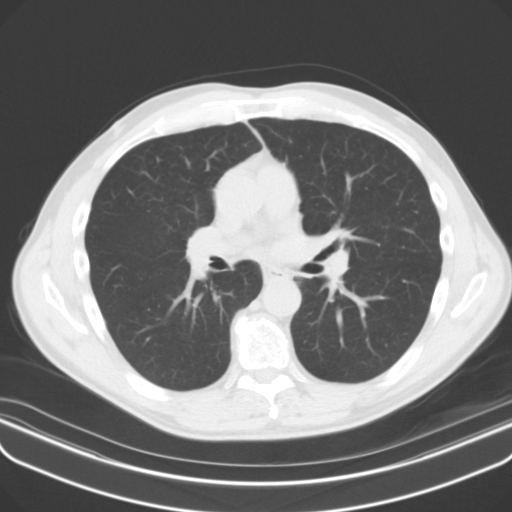
[im 44/73  lung]
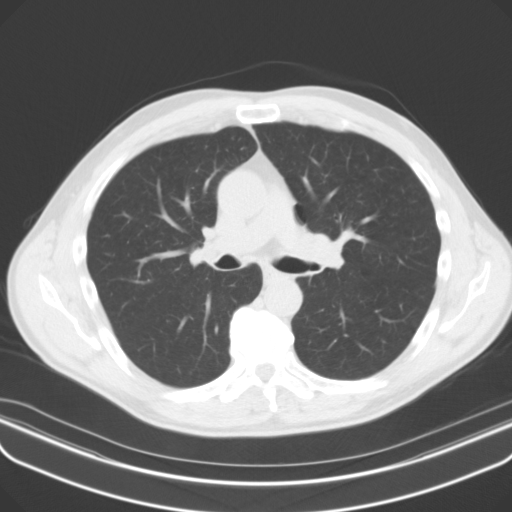
[im 49/73  lung]
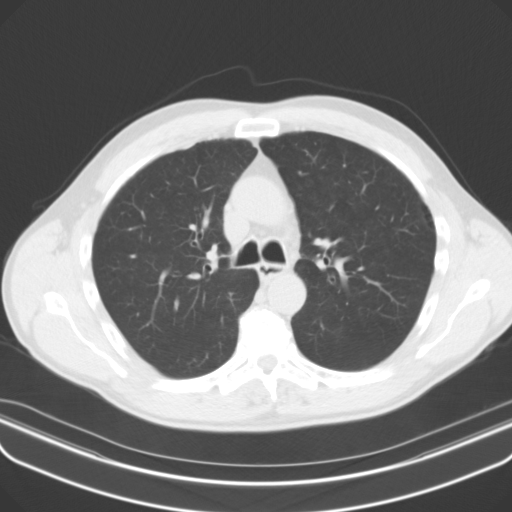
[im 54/73  lung]
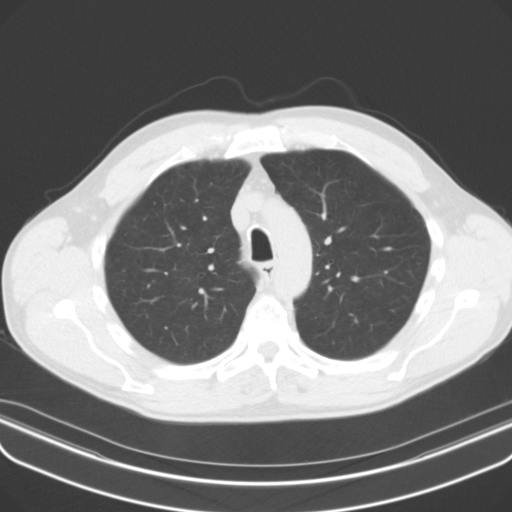
[im 58/73  mediastinal]
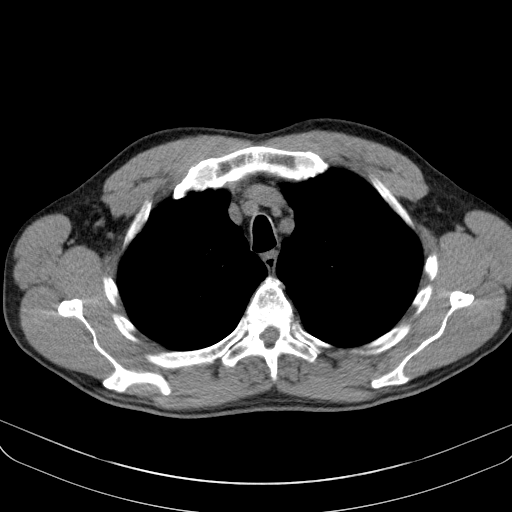
[im 58/73  lung]
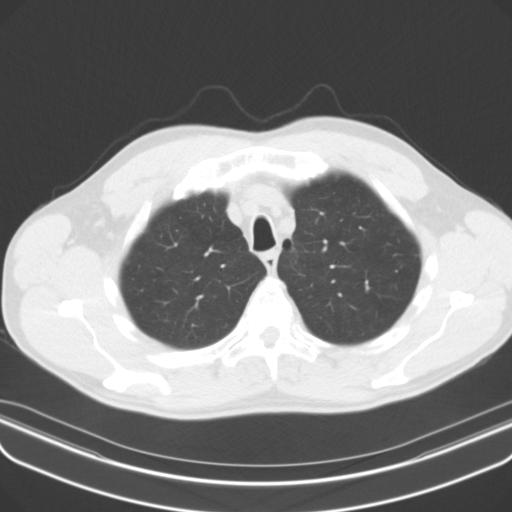
[im 62/73  lung]
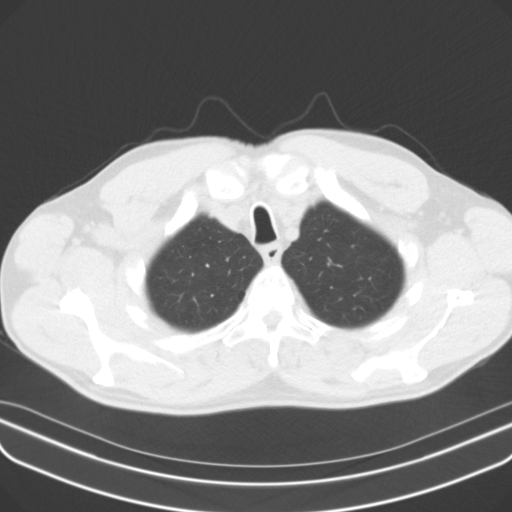
[im 67/73  lung]
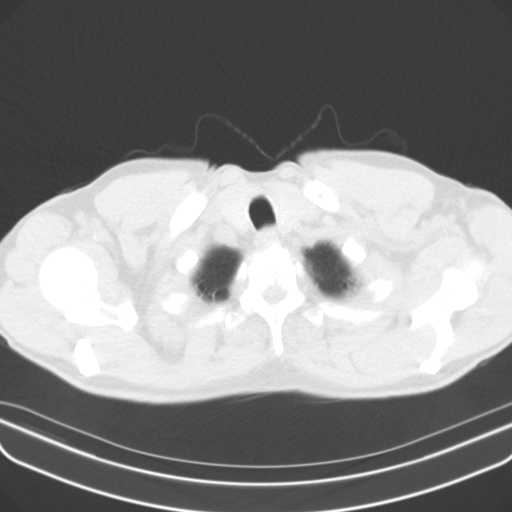

[15 of 33 positions shown; findings below may reference images not displayed]

FINDINGS: Cardiovascular: Normal heart size. No significant pericardial
effusion/thickening. Left anterior descending and right coronary
atherosclerosis. Mildly atherosclerotic nonaneurysmal thoracic
aorta. Normal caliber pulmonary arteries.

Mediastinum/Nodes: Hypodense 1.2 cm upper left thyroid nodule,
stable. Not clinically significant; no follow-up imaging recommended
(ref: [HOSPITAL]. [DATE]): 143-50). Unremarkable
esophagus. No pathologically enlarged axillary, mediastinal or hilar
lymph nodes, noting limited sensitivity for the detection of hilar
adenopathy on this noncontrast study.

Lungs/Pleura: No pneumothorax. No pleural effusion. Mild
centrilobular and paraseptal emphysema. No acute consolidative
airspace disease or lung masses. No significant growth of previously
visualized scattered pulmonary nodules. No new significant pulmonary
nodules.

Upper abdomen: No acute abnormality.

Musculoskeletal: No aggressive appearing focal osseous lesions. Mild
thoracic spondylosis. Partially visualized surgical hardware from
[facility].
IMPRESSION: 1. Lung-RADS 2, benign appearance or behavior. Continue annual
screening with low-dose chest CT without contrast in 12 months.
2. Two-vessel coronary atherosclerosis.
3. Aortic Atherosclerosis (XFOIG-IWZ.Z) and Emphysema (XFOIG-U3A.K).

## 2022-10-30 ENCOUNTER — Ambulatory Visit: Payer: BC Managed Care – PPO | Admitting: Family Medicine

## 2022-10-30 ENCOUNTER — Encounter: Payer: Self-pay | Admitting: Family Medicine

## 2022-10-30 VITALS — BP 134/80 | HR 81 | Temp 98.6°F | Ht 73.0 in | Wt 164.8 lb

## 2022-10-30 DIAGNOSIS — I10 Essential (primary) hypertension: Secondary | ICD-10-CM

## 2022-10-30 DIAGNOSIS — I2510 Atherosclerotic heart disease of native coronary artery without angina pectoris: Secondary | ICD-10-CM

## 2022-10-30 DIAGNOSIS — Z23 Encounter for immunization: Secondary | ICD-10-CM

## 2022-10-30 DIAGNOSIS — I2584 Coronary atherosclerosis due to calcified coronary lesion: Secondary | ICD-10-CM

## 2022-10-30 DIAGNOSIS — Z0000 Encounter for general adult medical examination without abnormal findings: Secondary | ICD-10-CM

## 2022-10-30 DIAGNOSIS — F418 Other specified anxiety disorders: Secondary | ICD-10-CM | POA: Diagnosis not present

## 2022-10-30 DIAGNOSIS — Z720 Tobacco use: Secondary | ICD-10-CM | POA: Diagnosis not present

## 2022-10-30 DIAGNOSIS — Z789 Other specified health status: Secondary | ICD-10-CM | POA: Diagnosis not present

## 2022-10-30 MED ORDER — VARENICLINE TARTRATE 0.5 MG PO TABS: ORAL_TABLET | ORAL | 0 refills | Status: DC

## 2022-10-30 MED ORDER — SERTRALINE HCL 100 MG PO TABS
100.0000 mg | ORAL_TABLET | Freq: Every day | ORAL | 3 refills | Status: DC
Start: 2022-10-30 — End: 2022-11-27

## 2022-10-30 NOTE — Progress Notes (Signed)
Established Patient Office Visit   Subjective:  Patient ID: Edward Bishop, male    DOB: Dec 23, 1960  Age: 62 y.o. MRN: [id number]  Chief Complaint  Patient presents with   Medical Management of Chronic Issues    6 month follow up, no concerns. Patient not fasting.     HPI Encounter Diagnoses  Name Primary?   Need for shingles vaccine Yes   Depression with anxiety    Alcohol use    Tobacco use    Coronary artery calcification    Healthcare maintenance    Essential hypertension    For yearly health check and multiple medical issues.  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  His father died back in 2022/08/29 from an RSV illness.  Feels as though his depression is worse.  Continues with regular dental care.  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  Mostly up-to-date on health maintenance.  He is due for his second shingles vaccine.  Continues to smoke about a pack a day and is interested in quitting.  Continues to drink lightly.   Review of Systems  Constitutional: Negative.   HENT: Negative.    Eyes:  Negative for blurred vision, discharge and redness.  Respiratory: Negative.    Cardiovascular: Negative.   Gastrointestinal:  Negative for abdominal pain.  Genitourinary: Negative.   Musculoskeletal: Negative.  Negative for myalgias.  Skin:  Negative for rash.  Neurological:  Negative for tingling, loss of consciousness and weakness.  Endo/Heme/Allergies:  Negative for polydipsia.      10/30/2022    8:27 AM 10/04/2021    8:58 AM 10/04/2021    8:29 AM  Depression screen PHQ 2/9  Decreased Interest 0 2 1  Down, Depressed, Hopeless 0 3 1  PHQ - 2 Score 0 5 2  Altered sleeping  3   Tired, decreased energy  3   Change in appetite  3   Feeling bad or failure about yourself   3   Trouble concentrating  2   Moving slowly or fidgety/restless  2   Suicidal thoughts  1   PHQ-9 Score  22   Difficult doing work/chores  Very difficult        Current  Outpatient Medications:    aspirin EC 81 MG tablet, Take 1 tablet (81 mg total) by mouth daily. Swallow whole., Disp: 90 tablet, Rfl: 3   atorvastatin (LIPITOR) 10 MG tablet, Take 1 tablet (10 mg total) by mouth daily., Disp: 90 tablet, Rfl: 3   losartan (COZAAR) 50 MG tablet, TAKE 1 TABLET BY MOUTH EVERY DAY, Disp: 90 tablet, Rfl: 3   sertraline (ZOLOFT) 100 MG tablet, Take 1 tablet (100 mg total) by mouth daily., Disp: 30 tablet, Rfl: 3   varenicline (CHANTIX) 0.5 MG tablet, Take 1 tablet (0.5 mg total) by mouth daily for 3 days, THEN 1 tablet (0.5 mg total) 2 (two) times daily for 4 days, THEN 2 tablets (1 mg total) 2 (two) times daily for 23 days. Start one week prior to quit date.., Disp: 103 tablet, Rfl: 0   Objective:     BP 134/80 (BP Location: Right Arm, Patient Position: Sitting, Cuff Size: Normal)   Pulse 81   Temp 98.6 F (37 C) (Temporal)   Ht 6\' 1"  (1.854 m)   Wt 164 lb 12.8 oz (74.8 kg)   SpO2 97%   BMI 21.74 kg/m    Physical Exam Constitutional:      General:  He is not in acute distress.    Appearance: Normal appearance. He is not ill-appearing, toxic-appearing or diaphoretic.  HENT:     Head: Normocephalic and atraumatic.     Right Ear: External ear normal.     Left Ear: External ear normal.     Mouth/Throat:     Mouth: Mucous membranes are moist.     Pharynx: Oropharynx is clear. No oropharyngeal exudate or posterior oropharyngeal erythema.  Eyes:     General: No scleral icterus.       Right eye: No discharge.        Left eye: No discharge.     Extraocular Movements: Extraocular movements intact.     Conjunctiva/sclera: Conjunctivae normal.     Pupils: Pupils are equal, round, and reactive to light.  Cardiovascular:     Rate and Rhythm: Normal rate and regular rhythm.  Pulmonary:     Effort: Pulmonary effort is normal. No respiratory distress.     Breath sounds: Normal breath sounds.  Abdominal:     General: Bowel sounds are normal.     Tenderness:  There is no abdominal tenderness. There is no guarding.     Hernia: No hernia is present.  Musculoskeletal:     Cervical back: No rigidity or tenderness.  Skin:    General: Skin is warm and dry.  Neurological:     Mental Status: He is alert and oriented to person, place, and time.  Psychiatric:        Mood and Affect: Mood normal.        Behavior: Behavior normal.      No results found for any visits on 10/30/22.    The 10-year ASCVD risk score (Arnett DK, et al., 2019) is: 11.1%    Assessment & Plan:   Need for shingles vaccine -     Varicella-zoster vaccine IM  Depression with anxiety -     Sertraline HCl; Take 1 tablet (100 mg total) by mouth daily.  Dispense: 30 tablet; Refill: 3  Alcohol use  Tobacco use -     Varenicline Tartrate; Take 1 tablet (0.5 mg total) by mouth daily for 3 days, THEN 1 tablet (0.5 mg total) 2 (two) times daily for 4 days, THEN 2 tablets (1 mg total) 2 (two) times daily for 23 days. Start one week prior to quit date..  Dispense: 103 tablet; Refill: 0  Coronary artery calcification -     Lipid panel -     Comprehensive metabolic panel; Future  Healthcare maintenance -     Hemoglobin A1c; Future -     PSA; Future -     Urinalysis, Routine w reflex microscopic; Future  Essential hypertension -     CBC with Differential/Platelet; Future -     Comprehensive metabolic panel; Future    Return Return fasting for blood work.  Return 1 month after starting Chantix..  Information was given on Chantix as well as his first months dosing.  He will follow-up once he decides to start it.  Have increased sotalol to 100 mg daily.  Continue current dosing of atorvastatin and losartan.  Adjustments made pending results of labs.  Information was given on health maintenance and disease prevention.  Libby Maw, MD

## 2022-11-25 ENCOUNTER — Other Ambulatory Visit: Payer: Self-pay | Admitting: Family Medicine

## 2022-11-25 DIAGNOSIS — F418 Other specified anxiety disorders: Secondary | ICD-10-CM

## 2022-11-25 DIAGNOSIS — Z720 Tobacco use: Secondary | ICD-10-CM

## 2022-12-21 ENCOUNTER — Ambulatory Visit: Payer: BC Managed Care – PPO | Admitting: Family Medicine

## 2022-12-26 ENCOUNTER — Encounter: Payer: Self-pay | Admitting: Family Medicine

## 2022-12-26 ENCOUNTER — Ambulatory Visit: Payer: BC Managed Care – PPO | Admitting: Family Medicine

## 2022-12-26 VITALS — BP 136/80 | HR 80 | Temp 98.5°F | Ht 73.0 in | Wt 162.0 lb

## 2022-12-26 DIAGNOSIS — Z720 Tobacco use: Secondary | ICD-10-CM

## 2022-12-26 MED ORDER — VARENICLINE TARTRATE 1 MG PO TABS
1.0000 mg | ORAL_TABLET | Freq: Two times a day (BID) | ORAL | 2 refills | Status: DC
Start: 2022-12-26 — End: 2023-03-19

## 2022-12-26 NOTE — Progress Notes (Signed)
Established Patient Office Visit   Subjective:  Patient ID: Edward Bishop, male    DOB: 12-Apr-1961  Age: 62 y.o. MRN: [id number]  Chief Complaint  Patient presents with   Medical Management of Chronic Issues    Routine follow up, no concerns. Patient fasting.     HPI Encounter Diagnoses  Name Primary?   Tobacco use Yes   For follow-up status post starting Chantix.  Doing well with the medication.  Has definitely reduced cravings.  He is down to about 10 cigarettes daily.  Denies bad dreams sadness or anger.   Review of Systems  Constitutional: Negative.   HENT: Negative.    Eyes:  Negative for blurred vision, discharge and redness.  Respiratory: Negative.    Cardiovascular: Negative.   Gastrointestinal:  Negative for abdominal pain.  Genitourinary: Negative.   Musculoskeletal: Negative.  Negative for myalgias.  Skin:  Negative for rash.  Neurological:  Negative for tingling, loss of consciousness and weakness.  Endo/Heme/Allergies:  Negative for polydipsia.     Current Outpatient Medications:    aspirin EC 81 MG tablet, Take 1 tablet (81 mg total) by mouth daily. Swallow whole., Disp: 90 tablet, Rfl: 3   atorvastatin (LIPITOR) 10 MG tablet, Take 1 tablet (10 mg total) by mouth daily., Disp: 90 tablet, Rfl: 3   losartan (COZAAR) 50 MG tablet, TAKE 1 TABLET BY MOUTH EVERY DAY, Disp: 90 tablet, Rfl: 3   sertraline (ZOLOFT) 100 MG tablet, TAKE 1 TABLET BY MOUTH EVERY DAY, Disp: 90 tablet, Rfl: 0   varenicline (CHANTIX CONTINUING MONTH PAK) 1 MG tablet, Take 1 tablet (1 mg total) by mouth 2 (two) times daily., Disp: 60 tablet, Rfl: 2   Objective:     BP 136/80 (BP Location: Right Arm, Patient Position: Sitting, Cuff Size: Normal)   Pulse 80   Temp 98.5 F (36.9 C) (Temporal)   Ht 6\' 1"  (1.854 m)   Wt 162 lb (73.5 kg)   SpO2 96%   BMI 21.37 kg/m    Physical Exam Constitutional:      General: He is not in acute distress.    Appearance: Normal appearance. He  is not ill-appearing, toxic-appearing or diaphoretic.  HENT:     Head: Normocephalic and atraumatic.     Right Ear: External ear normal.     Left Ear: External ear normal.  Eyes:     General: No scleral icterus.       Right eye: No discharge.        Left eye: No discharge.     Extraocular Movements: Extraocular movements intact.     Conjunctiva/sclera: Conjunctivae normal.  Pulmonary:     Effort: Pulmonary effort is normal. No respiratory distress.  Skin:    General: Skin is warm and dry.  Neurological:     Mental Status: He is alert and oriented to person, place, and time.  Psychiatric:        Mood and Affect: Mood normal.        Behavior: Behavior normal.      No results found for any visits on 12/26/22.    The 10-year ASCVD risk score (Arnett DK, et al., 2019) is: 11.4%    Assessment & Plan:   Tobacco use -     Varenicline Tartrate; Take 1 tablet (1 mg total) by mouth 2 (two) times daily.  Dispense: 60 tablet; Refill: 2    Return in about 3 months (around 03/28/2023), or if symptoms worsen or fail to improve.  Will return fasting for previously ordered blood work.  Mliss Sax, MD

## 2023-01-16 ENCOUNTER — Other Ambulatory Visit: Payer: BC Managed Care – PPO

## 2023-01-16 ENCOUNTER — Other Ambulatory Visit: Payer: Self-pay | Admitting: Acute Care

## 2023-01-16 DIAGNOSIS — Z87891 Personal history of nicotine dependence: Secondary | ICD-10-CM

## 2023-01-16 DIAGNOSIS — Z122 Encounter for screening for malignant neoplasm of respiratory organs: Secondary | ICD-10-CM

## 2023-01-16 DIAGNOSIS — F17210 Nicotine dependence, cigarettes, uncomplicated: Secondary | ICD-10-CM

## 2023-01-17 ENCOUNTER — Other Ambulatory Visit (INDEPENDENT_AMBULATORY_CARE_PROVIDER_SITE_OTHER): Payer: BC Managed Care – PPO

## 2023-01-17 DIAGNOSIS — I2510 Atherosclerotic heart disease of native coronary artery without angina pectoris: Secondary | ICD-10-CM | POA: Diagnosis not present

## 2023-01-17 DIAGNOSIS — I2584 Coronary atherosclerosis due to calcified coronary lesion: Secondary | ICD-10-CM

## 2023-01-17 DIAGNOSIS — Z0000 Encounter for general adult medical examination without abnormal findings: Secondary | ICD-10-CM

## 2023-01-17 DIAGNOSIS — I10 Essential (primary) hypertension: Secondary | ICD-10-CM

## 2023-01-17 LAB — URINALYSIS, ROUTINE W REFLEX MICROSCOPIC
Bilirubin Urine: NEGATIVE
Hgb urine dipstick: NEGATIVE
Ketones, ur: NEGATIVE
Leukocytes,Ua: NEGATIVE
Nitrite: NEGATIVE
Specific Gravity, Urine: 1.015 (ref 1.000–1.030)
Total Protein, Urine: NEGATIVE
Urine Glucose: NEGATIVE
Urobilinogen, UA: 0.2 (ref 0.0–1.0)
pH: 7 (ref 5.0–8.0)

## 2023-01-17 LAB — CBC WITH DIFFERENTIAL/PLATELET
Basophils Absolute: 0.1 10*3/uL (ref 0.0–0.1)
Basophils Relative: 0.6 % (ref 0.0–3.0)
Eosinophils Absolute: 0.3 10*3/uL (ref 0.0–0.7)
Eosinophils Relative: 2.3 % (ref 0.0–5.0)
HCT: 48.1 % (ref 39.0–52.0)
Hemoglobin: 15.8 g/dL (ref 13.0–17.0)
Lymphocytes Relative: 19 % (ref 12.0–46.0)
Lymphs Abs: 2.2 10*3/uL (ref 0.7–4.0)
MCHC: 32.9 g/dL (ref 30.0–36.0)
MCV: 98.7 fl (ref 78.0–100.0)
Monocytes Absolute: 0.9 10*3/uL (ref 0.1–1.0)
Monocytes Relative: 7.5 % (ref 3.0–12.0)
Neutro Abs: 8.1 10*3/uL — ABNORMAL HIGH (ref 1.4–7.7)
Neutrophils Relative %: 70.6 % (ref 43.0–77.0)
Platelets: 235 10*3/uL (ref 150.0–400.0)
RBC: 4.87 Mil/uL (ref 4.22–5.81)
RDW: 14 % (ref 11.5–15.5)
WBC: 11.5 10*3/uL — ABNORMAL HIGH (ref 4.0–10.5)

## 2023-01-17 LAB — COMPREHENSIVE METABOLIC PANEL
ALT: 24 U/L (ref 0–53)
AST: 22 U/L (ref 0–37)
Albumin: 4.7 g/dL (ref 3.5–5.2)
Alkaline Phosphatase: 92 U/L (ref 39–117)
BUN: 8 mg/dL (ref 6–23)
CO2: 27 mEq/L (ref 19–32)
Calcium: 9.9 mg/dL (ref 8.4–10.5)
Chloride: 98 mEq/L (ref 96–112)
Creatinine, Ser: 0.97 mg/dL (ref 0.40–1.50)
GFR: 84.69 mL/min (ref >60.00)
Glucose, Bld: 101 mg/dL — ABNORMAL HIGH (ref 70–99)
Potassium: 5 mEq/L (ref 3.5–5.1)
Sodium: 135 mEq/L (ref 135–145)
Total Bilirubin: 0.5 mg/dL (ref 0.2–1.2)
Total Protein: 7.2 g/dL (ref 6.0–8.3)

## 2023-01-17 LAB — PSA: PSA: 0.71 ng/mL (ref 0.10–4.00)

## 2023-01-17 LAB — HEMOGLOBIN A1C: Hgb A1c MFr Bld: 5.9 % (ref 4.6–6.5)

## 2023-01-28 ENCOUNTER — Other Ambulatory Visit: Payer: Self-pay | Admitting: Cardiovascular Disease

## 2023-02-21 ENCOUNTER — Other Ambulatory Visit: Payer: Self-pay | Admitting: Cardiovascular Disease

## 2023-02-23 ENCOUNTER — Other Ambulatory Visit: Payer: Self-pay | Admitting: Family Medicine

## 2023-02-23 DIAGNOSIS — Z720 Tobacco use: Secondary | ICD-10-CM

## 2023-02-27 ENCOUNTER — Other Ambulatory Visit: Payer: Self-pay | Admitting: Family Medicine

## 2023-02-27 DIAGNOSIS — F418 Other specified anxiety disorders: Secondary | ICD-10-CM

## 2023-02-28 ENCOUNTER — Encounter (INDEPENDENT_AMBULATORY_CARE_PROVIDER_SITE_OTHER): Payer: Self-pay

## 2023-03-17 ENCOUNTER — Other Ambulatory Visit: Payer: Self-pay | Admitting: Family Medicine

## 2023-03-17 DIAGNOSIS — Z720 Tobacco use: Secondary | ICD-10-CM

## 2023-03-20 ENCOUNTER — Other Ambulatory Visit: Payer: Self-pay | Admitting: Cardiovascular Disease

## 2023-03-27 ENCOUNTER — Ambulatory Visit
Admission: RE | Admit: 2023-03-27 | Discharge: 2023-03-27 | Disposition: A | Discharge status: Home / Self Care | Payer: BC Managed Care – PPO | Source: Ambulatory Visit | Attending: Acute Care | Admitting: Acute Care

## 2023-03-27 DIAGNOSIS — Z122 Encounter for screening for malignant neoplasm of respiratory organs: Secondary | ICD-10-CM

## 2023-03-27 DIAGNOSIS — Z87891 Personal history of nicotine dependence: Secondary | ICD-10-CM

## 2023-03-27 DIAGNOSIS — F17210 Nicotine dependence, cigarettes, uncomplicated: Secondary | ICD-10-CM

## 2023-03-28 ENCOUNTER — Ambulatory Visit: Payer: BC Managed Care – PPO | Admitting: Family Medicine

## 2023-04-02 ENCOUNTER — Other Ambulatory Visit: Payer: Self-pay | Admitting: Family Medicine

## 2023-04-02 DIAGNOSIS — Z720 Tobacco use: Secondary | ICD-10-CM

## 2023-04-04 ENCOUNTER — Other Ambulatory Visit: Payer: Self-pay | Admitting: Cardiovascular Disease

## 2023-04-06 ENCOUNTER — Telehealth: Payer: Self-pay | Admitting: Acute Care

## 2023-04-06 DIAGNOSIS — Z87891 Personal history of nicotine dependence: Secondary | ICD-10-CM

## 2023-04-06 DIAGNOSIS — Z122 Encounter for screening for malignant neoplasm of respiratory organs: Secondary | ICD-10-CM

## 2023-04-06 DIAGNOSIS — F17210 Nicotine dependence, cigarettes, uncomplicated: Secondary | ICD-10-CM

## 2023-04-06 NOTE — Telephone Encounter (Signed)
Patient call back. Spoke with patient and discussed results. Patient states he had bronchitis about 2 months ago. Discussed with patient about seeing his PCP, pt states he is due for an ROV. Advised pt to call his PCP and schedule OV. Results send to PCP. 12 month repeat order placed.

## 2023-04-06 NOTE — Telephone Encounter (Signed)
Called and left VM for pt to review results.    IMPRESSION: 1. Lung-RADS 2, benign appearance or behavior. Continue annual screening with low-dose chest CT without contrast in 12 months. 2. New peribronchovascular ground-glass and slight architectural distortion in the posterior left upper lobe, likely infectious/inflammatory in etiology. 3. Age advanced coronary artery calcification. 4.  Emphysema (ICD10-J43.9).     Electronically Signed   By: Leanna Battles M.D.   On: 04/04/2023 16:10

## 2023-07-16 ENCOUNTER — Other Ambulatory Visit: Payer: Self-pay | Admitting: Family Medicine

## 2023-07-16 DIAGNOSIS — F418 Other specified anxiety disorders: Secondary | ICD-10-CM

## 2023-07-16 DIAGNOSIS — I2510 Atherosclerotic heart disease of native coronary artery without angina pectoris: Secondary | ICD-10-CM

## 2023-08-06 ENCOUNTER — Telehealth: Payer: Self-pay | Admitting: *Deleted

## 2023-08-06 NOTE — Telephone Encounter (Signed)
 In office preop visit now scheduled for 1/8 @8 :50 a.m., pt provided with address and agrees to appt date and time.

## 2023-08-06 NOTE — Telephone Encounter (Signed)
   Pre-operative Risk Assessment    Patient Name: Edward Bishop  DOB: Nov 30, 1960 MRN: [id number]   Date of last office visit: 01/06/22 DR. NAHSER Date of next office visit: NONE   Request for Surgical Clearance    Procedure:   COLONOSCOPY  Date of Surgery:  Clearance 08/14/23                                Surgeon:  DR. HUNG Surgeon's Group or Practice Name:  [facility] Phone number:  [phone] Fax number:  [phone]   Type of Clearance Requested:   - Medical ; NONE LISTED NEEDING TO BE HELD PER CLEARANCE FORM   Type of Anesthesia:   PROPOFOL   Additional requests/questions:    Bonney Niels Jest   08/06/2023, 2:46 PM

## 2023-08-06 NOTE — Telephone Encounter (Signed)
 Primary Cardiologist:Philip Nahser, MD  Chart reviewed as part of pre-operative protocol coverage. Because of BENITO LEMMERMAN past medical history and time since last visit, he/she will require a follow-up visit in order to better assess preoperative cardiovascular risk.  Pre-op covering staff: - Please schedule appointment and call patient to inform them. - Please contact requesting surgeon's office via preferred method (i.e, phone, fax) to inform them of need for appointment prior to surgery.  Per office protocol and pending no concerning cardiac symptoms at time of visit, he may hold aspirin  for 5-7 days prior to procedure and should resume as soon as hemodynamically stable postoperatively.   Rosaline EMERSON Bane, NP-C  08/06/2023, 3:11 PM [address], [address] Office [phone] Fax [phone]

## 2023-08-08 ENCOUNTER — Ambulatory Visit: Payer: BC Managed Care – PPO | Attending: Physician Assistant | Admitting: Physician Assistant

## 2023-08-08 ENCOUNTER — Encounter: Payer: Self-pay | Admitting: Physician Assistant

## 2023-08-08 VITALS — BP 162/86 | HR 74 | Ht 72.0 in | Wt 166.6 lb

## 2023-08-08 DIAGNOSIS — I471 Supraventricular tachycardia, unspecified: Secondary | ICD-10-CM | POA: Diagnosis not present

## 2023-08-08 DIAGNOSIS — I10 Essential (primary) hypertension: Secondary | ICD-10-CM

## 2023-08-08 DIAGNOSIS — E785 Hyperlipidemia, unspecified: Secondary | ICD-10-CM | POA: Diagnosis not present

## 2023-08-08 DIAGNOSIS — Z01818 Encounter for other preprocedural examination: Secondary | ICD-10-CM | POA: Diagnosis not present

## 2023-08-08 MED ORDER — LOSARTAN POTASSIUM 50 MG PO TABS: 50.0000 mg | ORAL_TABLET | Freq: Every day | ORAL | 3 refills | Status: AC

## 2023-08-08 MED ORDER — ATORVASTATIN CALCIUM 10 MG PO TABS: 10.0000 mg | ORAL_TABLET | Freq: Every day | ORAL | 3 refills | Status: AC

## 2023-08-08 NOTE — Progress Notes (Signed)
  Cardiology Office Note:  .   Date:  08/08/2023  ID:  Edward Bishop Signs, DOB [birth year], MRN [id number] PCP: Edward Elsie Sayre, MD  [facility] Providers Cardiologist:  Edward Passe, MD {  History of Present Illness: Edward Bishop is a 63 y.o. male w/PMHx of SVT, HTN, COPD + smoker  He last saw Edward Bishop 01/06/22, discussed hx of a normal cath +/- 2009, hx of an SVT that he has rarely responds to valsalva, active, busy, exercising, feeling well,  Encouraged to stop smoking, BP was well controlled No changes were made.  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 Reports no exertional intolerances No CP, SVT has not bothered him in a long time Typically no SOB, no DOE He reports infrequently when laughing really hard he will lose his air and use a rescue inhaler. Continues to smoke some No dizzy spells, near syncope or syncope.  Reports the colonoscopy is for screening  Studies Reviewed: Edward Bishop    EKG done today and reviewed by myself:  SR 74bpm, no ectopy   Risk Assessment/Calculations:    Physical Exam:   VS:  There were no vitals taken for this visit.   Wt Readings from Last 3 Encounters:  12/26/22 162 lb (73.5 kg)  10/30/22 164 lb 12.8 oz (74.8 kg)  05/29/22 166 lb 9.6 oz (75.6 kg)    GEN: Well nourished, well developed in no acute distress NECK: No JVD; No carotid bruits CARDIAC: RR, no murmurs, rubs, gallops RESPIRATORY:  CTA b/l without rales, not wheezing, no rhonchi  ABDOMEN: Soft, non-tender, non-distended EXTREMITIES: No edema; No deformity    ASSESSMENT AND PLAN: .    HTN Has been out of his meds a couple weeks, needs refills  SVT No palpitations Reports rare episodes  3. HLD Out of his atorvastatin  Requests labs today, he is fasting Will send  refill on his stain, get the labs done, defer management to Edward Bishop  Pre-procedure evaluation Colonoscopy considered low cardiac risk procedure Pt has low RCRI score Reports excellent exertional capacity without symptoms or difficulties No cardiac contraindication to colonoscopy   Encouraged to quit smoking, heart healthy diet/living Deferred inhaler (not on his med list) to his PMD/pulm team    Dispo: back to see Edward Bishop in a year again, sooner if needed    Signed, Edward Macario Arthur, PA-C

## 2023-08-08 NOTE — Patient Instructions (Signed)
 Medication Instructions:   Your physician recommends that you continue on your current medications as directed. Please refer to the Current Medication list given to you today.  *If you need a refill on your cardiac medications before your next appointment, please call your pharmacy*   Lab Work:   PLEASE GO DOWN STAIRS FIRST FLOOR  [address] : CMET  AND LIPIDS  TODAY     If you have labs (blood work) drawn today and your tests are completely normal, you will receive your results only by: MyChart Message (if you have MyChart) OR A paper copy in the mail If you have any lab test that is abnormal or we need to change your treatment, we will call you to review the results.   Testing/Procedures: NONE ORDERED  TODAY      Follow-Up: At [facility], you and your health needs are our priority.  As part of our continuing mission to provide you with exceptional heart care, we have created designated Provider Care Teams.  These Care Teams include your primary Cardiologist (physician) and Advanced Practice Providers (APPs -  Physician Assistants and Nurse Practitioners) who all work together to provide you with the care you need, when you need it.  We recommend signing up for the patient portal called MyChart.  Sign up information is provided on this After Visit Summary.  MyChart is used to connect with patients for Virtual Visits (Telemedicine).  Patients are able to view lab/test results, encounter notes, upcoming appointments, etc.  Non-urgent messages can be sent to your provider as well.   To learn more about what you can do with MyChart, go to [email or web address].    Your next appointment:   1 year(s)  Provider:    Orren Fabry, PA-C, Dayna Dunn, PA-C, Jackee Alberts, NP, Olivia Pavy, PA-C, Rosaline Bane, NP, Glendia Ferrier, PA-C, or Artist Pouch, PA-C     T   Other Instructions

## 2023-08-14 LAB — HM COLONOSCOPY

## 2023-09-04 ENCOUNTER — Encounter: Payer: Self-pay | Admitting: Family Medicine

## 2023-09-04 ENCOUNTER — Ambulatory Visit: Payer: BC Managed Care – PPO | Admitting: Family Medicine

## 2023-09-04 VITALS — BP 138/68 | HR 68 | Temp 98.4°F | Ht 72.0 in | Wt 163.2 lb

## 2023-09-04 DIAGNOSIS — F418 Other specified anxiety disorders: Secondary | ICD-10-CM

## 2023-09-04 DIAGNOSIS — Z789 Other specified health status: Secondary | ICD-10-CM | POA: Diagnosis not present

## 2023-09-04 DIAGNOSIS — J449 Chronic obstructive pulmonary disease, unspecified: Secondary | ICD-10-CM

## 2023-09-04 DIAGNOSIS — Z720 Tobacco use: Secondary | ICD-10-CM | POA: Diagnosis not present

## 2023-09-04 DIAGNOSIS — I2510 Atherosclerotic heart disease of native coronary artery without angina pectoris: Secondary | ICD-10-CM | POA: Diagnosis not present

## 2023-09-04 MED ORDER — ALBUTEROL SULFATE HFA 108 (90 BASE) MCG/ACT IN AERS: 1.0000 | INHALATION_SPRAY | Freq: Four times a day (QID) | RESPIRATORY_TRACT | 2 refills | Status: AC | PRN

## 2023-09-04 MED ORDER — SERTRALINE HCL 100 MG PO TABS: 100.0000 mg | ORAL_TABLET | Freq: Every day | ORAL | 2 refills | Status: DC

## 2023-09-04 NOTE — Progress Notes (Signed)
 Established Patient Office Visit   Subjective:  Patient ID: Edward Bishop, male    DOB: 28-Feb-1961  Age: 63 y.o. MRN: [id number]  Chief Complaint  Patient presents with   Medication Refill    Rx refill for sertraline .     Medication Refill Pertinent negatives include no abdominal pain, myalgias, rash or weakness.   Encounter Diagnoses  Name Primary?   Alcohol use Yes   Depression with anxiety    Coronary artery calcification    Tobacco use    Chronic obstructive pulmonary disease, unspecified COPD type (HCC)    For follow-up after stopping sertraline  about a month ago.  He wanted to see what life was like without it.  He would like to restart it.  He sees now that it is made a big difference for him.  This past Christmas was tough for him.  It was the first Christmas without his father and he lost his brother-in-law in a few weeks before Christmas.  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  He is down to smoking about 10 cigarettes a day and is still attempting to quit.  He has 2-3 beers twice weekly and when he goes out with friends.  Continues with atorvastatin .   Review of Systems  Constitutional: Negative.   HENT: Negative.    Eyes:  Negative for blurred vision, discharge and redness.  Respiratory: Negative.    Cardiovascular: Negative.   Gastrointestinal:  Negative for abdominal pain.  Genitourinary: Negative.   Musculoskeletal: Negative.  Negative for myalgias.  Skin:  Negative for rash.  Neurological:  Negative for tingling, loss of consciousness and weakness.  Endo/Heme/Allergies:  Negative for polydipsia.     Current Outpatient Medications:    albuterol  (VENTOLIN  HFA) 108 (90 Base) MCG/ACT inhaler, Inhale 1-2 puffs into the lungs every 6 (six) hours as needed for wheezing or shortness of breath., Disp: 18 g, Rfl: 2   aspirin  EC 81 MG tablet, Take 1 tablet (81 mg total) by mouth daily. Swallow whole., Disp: 90 tablet, Rfl: 3   atorvastatin   (LIPITOR) 10 MG tablet, Take 1 tablet (10 mg total) by mouth daily., Disp: 90 tablet, Rfl: 3   losartan  (COZAAR ) 50 MG tablet, Take 1 tablet (50 mg total) by mouth daily., Disp: 90 tablet, Rfl: 3   varenicline  (CHANTIX ) 1 MG tablet, TAKE 1 TABLET BY MOUTH TWICE A DAY, Disp: 168 tablet, Rfl: 1   sertraline  (ZOLOFT ) 100 MG tablet, Take 1 tablet (100 mg total) by mouth daily., Disp: 90 tablet, Rfl: 2   Objective:     BP 138/68   Pulse 68   Temp 98.4 F (36.9 C)   Ht 6' (1.829 m)   Wt 163 lb 3.2 oz (74 kg)   SpO2 98%   BMI 22.13 kg/m    Physical Exam Constitutional:      General: He is not in acute distress.    Appearance: Normal appearance. He is not ill-appearing, toxic-appearing or diaphoretic.  HENT:     Head: Normocephalic and atraumatic.     Right Ear: External ear normal.     Left Ear: External ear normal.  Eyes:     General: No scleral icterus.       Right eye: No discharge.        Left eye: No discharge.     Extraocular Movements: Extraocular movements intact.     Conjunctiva/sclera: Conjunctivae normal.  Cardiovascular:     Rate and Rhythm: Normal rate  and regular rhythm.  Pulmonary:     Effort: Pulmonary effort is normal. No respiratory distress.     Breath sounds: No wheezing or rales.  Skin:    General: Skin is warm and dry.  Neurological:     Mental Status: He is alert and oriented to person, place, and time.  Psychiatric:        Mood and Affect: Mood normal.        Behavior: Behavior normal.      No results found for any visits on 09/04/23.    The 10-year ASCVD risk score (Arnett DK, et al., 2019) is: 12.5%    Assessment & Plan:   Alcohol use  Depression with anxiety -     Sertraline  HCl; Take 1 tablet (100 mg total) by mouth daily.  Dispense: 90 tablet; Refill: 2  Coronary artery calcification  Tobacco use  Chronic obstructive pulmonary disease, unspecified COPD type (HCC) -     Albuterol  Sulfate HFA; Inhale 1-2 puffs into the lungs  every 6 (six) hours as needed for wheezing or shortness of breath.  Dispense: 18 g; Refill: 2    Return if symptoms worsen or fail to improve, for annual physical.  Restart sertraline  and continue.  Return in 1 month if not back to baseline.  Albuterol  inhaler 1 to 2 puffs every 6 as needed.  Encouraged ongoing tobacco cessation efforts.  Elsie Sim Lent, MD

## 2023-09-07 ENCOUNTER — Telehealth: Payer: Self-pay | Admitting: Family Medicine

## 2023-09-07 NOTE — Telephone Encounter (Deleted)
 Copied from CRM [id number]. Topic: Clinical - Medication Question >> Sep 07, 2023  9:23 AM Nestora J wrote: Reason for CRM: Pt states he has was prescribed Sertraline  at his last appointment for depression and he is feeling better, he says he's been taking it before and he's always done well with it and wants to continue taking it. He also states he wants to stop taking Varenicline , but he wants to know if he can go cold turkey or does he have to ease his way down? Overall he wants to get off the medication and continue with the new one. He is requesting a callback so that he can discuss this with someone   Callback # [phone]

## 2023-09-07 NOTE — Telephone Encounter (Signed)
 Copied from CRM [id number]. Topic: Clinical - Medication Question >> Sep 07, 2023  9:23 AM Edward Bishop wrote: Reason for CRM: Pt states he has was prescribed Sertraline  at his last appointment for depression and he is feeling better, he says he's been taking it before and he's always done well with it and wants to continue taking it. He also states he wants to stop taking Varenicline , but he wants to know if he can go cold turkey or does he have to ease his way down? Overall he wants to get off the medication and continue with the new one. He is requesting a callback so that he can discuss this with someone   Callback # [phone]

## 2023-11-15 ENCOUNTER — Encounter: Payer: Self-pay | Admitting: Family Medicine

## 2023-11-15 ENCOUNTER — Ambulatory Visit (INDEPENDENT_AMBULATORY_CARE_PROVIDER_SITE_OTHER): Admitting: Family Medicine

## 2023-11-15 VITALS — BP 140/78 | HR 72 | Temp 99.7°F | Resp 19 | Ht 72.0 in | Wt 159.4 lb

## 2023-11-15 DIAGNOSIS — F1090 Alcohol use, unspecified, uncomplicated: Secondary | ICD-10-CM | POA: Diagnosis not present

## 2023-11-15 DIAGNOSIS — E871 Hypo-osmolality and hyponatremia: Secondary | ICD-10-CM

## 2023-11-15 DIAGNOSIS — N401 Enlarged prostate with lower urinary tract symptoms: Secondary | ICD-10-CM

## 2023-11-15 DIAGNOSIS — I2510 Atherosclerotic heart disease of native coronary artery without angina pectoris: Secondary | ICD-10-CM | POA: Diagnosis not present

## 2023-11-15 DIAGNOSIS — Z720 Tobacco use: Secondary | ICD-10-CM | POA: Diagnosis not present

## 2023-11-15 DIAGNOSIS — F418 Other specified anxiety disorders: Secondary | ICD-10-CM

## 2023-11-15 DIAGNOSIS — I10 Essential (primary) hypertension: Secondary | ICD-10-CM | POA: Diagnosis not present

## 2023-11-15 DIAGNOSIS — Z0000 Encounter for general adult medical examination without abnormal findings: Secondary | ICD-10-CM

## 2023-11-15 DIAGNOSIS — R3911 Hesitancy of micturition: Secondary | ICD-10-CM | POA: Diagnosis not present

## 2023-11-15 DIAGNOSIS — Z131 Encounter for screening for diabetes mellitus: Secondary | ICD-10-CM

## 2023-11-15 DIAGNOSIS — Z23 Encounter for immunization: Secondary | ICD-10-CM | POA: Diagnosis not present

## 2023-11-15 LAB — CBC WITH DIFFERENTIAL/PLATELET
Basophils Absolute: 0 10*3/uL (ref 0.0–0.1)
Basophils Relative: 0.2 % (ref 0.0–3.0)
Eosinophils Absolute: 0 10*3/uL (ref 0.0–0.7)
Eosinophils Relative: 0.4 % (ref 0.0–5.0)
HCT: 46.1 % (ref 39.0–52.0)
Hemoglobin: 15.6 g/dL (ref 13.0–17.0)
Lymphocytes Relative: 15.3 % (ref 12.0–46.0)
Lymphs Abs: 1.6 10*3/uL (ref 0.7–4.0)
MCHC: 33.9 g/dL (ref 30.0–36.0)
MCV: 98.3 fl (ref 78.0–100.0)
Monocytes Absolute: 0.9 10*3/uL (ref 0.1–1.0)
Monocytes Relative: 8.5 % (ref 3.0–12.0)
Neutro Abs: 7.9 10*3/uL — ABNORMAL HIGH (ref 1.4–7.7)
Neutrophils Relative %: 75.6 % (ref 43.0–77.0)
Platelets: 232 10*3/uL (ref 150.0–400.0)
RBC: 4.69 Mil/uL (ref 4.22–5.81)
RDW: 13.8 % (ref 11.5–15.5)
WBC: 10.5 10*3/uL (ref 4.0–10.5)

## 2023-11-15 LAB — URINALYSIS, ROUTINE W REFLEX MICROSCOPIC
Bilirubin Urine: NEGATIVE
Ketones, ur: NEGATIVE
Leukocytes,Ua: NEGATIVE
Nitrite: NEGATIVE
Specific Gravity, Urine: 1.01 (ref 1.000–1.030)
Total Protein, Urine: NEGATIVE
Urine Glucose: NEGATIVE
Urobilinogen, UA: 0.2 (ref 0.0–1.0)
WBC, UA: NONE SEEN (ref 0–?)
pH: 6 (ref 5.0–8.0)

## 2023-11-15 LAB — COMPREHENSIVE METABOLIC PANEL WITH GFR
ALT: 21 U/L (ref 0–53)
AST: 23 U/L (ref 0–37)
Albumin: 5.1 g/dL (ref 3.5–5.2)
Alkaline Phosphatase: 95 U/L (ref 39–117)
BUN: 6 mg/dL (ref 6–23)
CO2: 28 meq/L (ref 19–32)
Calcium: 9.7 mg/dL (ref 8.4–10.5)
Chloride: 94 meq/L — ABNORMAL LOW (ref 96–112)
Creatinine, Ser: 0.89 mg/dL (ref 0.40–1.50)
GFR: 92.43 mL/min (ref >60.00)
Glucose, Bld: 91 mg/dL (ref 70–99)
Potassium: 4.5 meq/L (ref 3.5–5.1)
Sodium: 129 meq/L — ABNORMAL LOW (ref 135–145)
Total Bilirubin: 0.4 mg/dL (ref 0.2–1.2)
Total Protein: 7 g/dL (ref 6.0–8.3)

## 2023-11-15 LAB — LIPID PANEL
Cholesterol: 132 mg/dL (ref 0–200)
HDL: 80 mg/dL (ref >39.00)
LDL Cholesterol: 44 mg/dL (ref 0–99)
NonHDL: 52.32
Total CHOL/HDL Ratio: 2
Triglycerides: 43 mg/dL (ref 0.0–149.0)
VLDL: 8.6 mg/dL (ref 0.0–40.0)

## 2023-11-15 LAB — HEMOGLOBIN A1C: Hgb A1c MFr Bld: 5.9 % (ref 4.6–6.5)

## 2023-11-15 LAB — PSA: PSA: 0.66 ng/mL (ref 0.10–4.00)

## 2023-11-15 LAB — TSH: TSH: 1.17 u[IU]/mL (ref 0.35–5.50)

## 2023-11-15 NOTE — Progress Notes (Addendum)
 J  Established Patient Office Visit   Subjective:  Patient ID: Edward Bishop, male    DOB: [birth year]  Age: 63 y.o. MRN: [id number]  Chief Complaint  Patient presents with   Annual Exam    Pt is fasting. Pt C/O of fatigue and is working on decreasing tobacco use 1 (pack per day) Rx refill request for 90 day supply.   HM due- TDAP vaccine     HPI Encounter Diagnoses  Name Primary?   Healthcare maintenance Yes   Depression with anxiety    Alcohol use    Tobacco use    Coronary artery calcification    Benign prostatic hyperplasia with urinary hesitancy    Essential hypertension    Screening for diabetes mellitus    Immunization due    Hyponatremia    Here for physical and follow-up of above.  He is now edentulous.  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  Things are better he says since he restarted the sertraline  but still feels depression and anxiety.  He has started drinking heavily again.  He decided not to start the Chantix  over worry about side effects.  He is experienced some decrease in the force of his stream.  Blood pressure is normally controlled but he has not taken his medications this morning.  Gets up at night to urinate.  He is drinking fluids right up until bedtime.  Status post tick bite to mid back.  Would like for me to check it.  Has been having left ankle pain.   Review of Systems  Constitutional:  Positive for malaise/fatigue.  HENT: Negative.    Eyes:  Negative for blurred vision, discharge and redness.  Respiratory: Negative.    Cardiovascular: Negative.   Gastrointestinal:  Negative for abdominal pain.  Genitourinary: Negative.   Musculoskeletal: Negative.  Negative for myalgias.  Skin:  Negative for rash.  Neurological:  Negative for tingling, loss of consciousness and weakness.  Endo/Heme/Allergies:  Negative for polydipsia.  Psychiatric/Behavioral:  Positive for depression. Negative for suicidal ideas. The patient is  nervous/anxious. The patient does not have insomnia.       11/15/2023   10:13 AM 09/04/2023    3:25 PM 12/26/2022   10:36 AM  Depression screen PHQ 2/9  Decreased Interest 3 3 0  Down, Depressed, Hopeless 3 3 0  PHQ - 2 Score 6 6 0  Altered sleeping 3 3   Tired, decreased energy 3 3   Change in appetite 2 2   Feeling bad or failure about yourself  2 2   Trouble concentrating 3 3   Moving slowly or fidgety/restless 3 3   Suicidal thoughts 0 0   PHQ-9 Score 22 22   Difficult doing work/chores Somewhat difficult Somewhat difficult       Current Outpatient Medications:    albuterol  (VENTOLIN  HFA) 108 (90 Base) MCG/ACT inhaler, Inhale 1-2 puffs into the lungs every 6 (six) hours as needed for wheezing or shortness of breath., Disp: 18 g, Rfl: 2   aspirin  EC 81 MG tablet, Take 1 tablet (81 mg total) by mouth daily. Swallow whole., Disp: 90 tablet, Rfl: 3   atorvastatin  (LIPITOR) 10 MG tablet, Take 1 tablet (10 mg total) by mouth daily., Disp: 90 tablet, Rfl: 3   losartan  (COZAAR ) 50 MG tablet, Take 1 tablet (50 mg total) by mouth daily., Disp: 90 tablet, Rfl: 3   sertraline  (ZOLOFT ) 100 MG tablet, Take 1 tablet (100 mg total) by mouth daily.,  Disp: 90 tablet, Rfl: 2   Objective:     BP (!) 140/78 (BP Location: Left Arm, Patient Position: Sitting, Cuff Size: Normal) Comment: recheck after resting  Pulse 72   Temp 99.7 F (37.6 C) (Temporal) Comment: pt had on hat  Resp 19   Ht 6' (1.829 m)   Wt 159 lb 6.4 oz (72.3 kg)   SpO2 99%   BMI 21.62 kg/m    Physical Exam Constitutional:      General: He is not in acute distress.    Appearance: Normal appearance. He is not ill-appearing, toxic-appearing or diaphoretic.  HENT:     Head: Normocephalic and atraumatic.     Right Ear: Tympanic membrane, ear canal and external ear normal.     Left Ear: Tympanic membrane, ear canal and external ear normal.     Mouth/Throat:     Mouth: Mucous membranes are moist.     Pharynx: Oropharynx is  clear. No oropharyngeal exudate or posterior oropharyngeal erythema.  Eyes:     General: No scleral icterus.       Right eye: No discharge.        Left eye: No discharge.     Extraocular Movements: Extraocular movements intact.     Conjunctiva/sclera: Conjunctivae normal.     Pupils: Pupils are equal, round, and reactive to light.  Cardiovascular:     Rate and Rhythm: Normal rate and regular rhythm.  Pulmonary:     Effort: Pulmonary effort is normal. No respiratory distress.     Breath sounds: Normal breath sounds. Decreased air movement present.  Abdominal:     General: Bowel sounds are normal.     Tenderness: There is no abdominal tenderness. There is no guarding.     Hernia: There is no hernia in the left inguinal area or right inguinal area.  Genitourinary:    Penis: Circumcised. No hypospadias, erythema, tenderness, discharge, swelling or lesions.      Testes:        Right: Mass, tenderness or swelling not present. Right testis is descended.        Left: Mass, tenderness or swelling not present. Left testis is descended.     Epididymis:     Right: Not inflamed or enlarged.     Left: Not inflamed or enlarged.     Prostate: Not enlarged, not tender and no nodules present.     Rectum: Guaiac result negative. External hemorrhoid present. No mass, tenderness, anal fissure or internal hemorrhoid. Normal anal tone.  Musculoskeletal:     Cervical back: No rigidity or tenderness.  Lymphadenopathy:     Lower Body: No right inguinal adenopathy. No left inguinal adenopathy.  Skin:    General: Skin is warm and dry.       Neurological:     Mental Status: He is alert and oriented to person, place, and time.  Psychiatric:        Mood and Affect: Mood normal.        Behavior: Behavior normal.      Results for orders placed or performed in visit on 11/15/23  CBC with Differential/Platelet  Result Value Ref Range   WBC 10.5 4.0 - 10.5 K/uL   RBC 4.69 4.22 - 5.81 Mil/uL   Hemoglobin  15.6 13.0 - 17.0 g/dL   HCT 53.8 60.9 - 47.9 %   MCV 98.3 78.0 - 100.0 fl   MCHC 33.9 30.0 - 36.0 g/dL   RDW 86.1 88.4 - 84.4 %   Platelets  232.0 150.0 - 400.0 K/uL   Neutrophils Relative % 75.6 43.0 - 77.0 %   Lymphocytes Relative 15.3 12.0 - 46.0 %   Monocytes Relative 8.5 3.0 - 12.0 %   Eosinophils Relative 0.4 0.0 - 5.0 %   Basophils Relative 0.2 0.0 - 3.0 %   Neutro Abs 7.9 (H) 1.4 - 7.7 K/uL   Lymphs Abs 1.6 0.7 - 4.0 K/uL   Monocytes Absolute 0.9 0.1 - 1.0 K/uL   Eosinophils Absolute 0.0 0.0 - 0.7 K/uL   Basophils Absolute 0.0 0.0 - 0.1 K/uL  Comprehensive metabolic panel with GFR  Result Value Ref Range   Sodium 129 (L) 135 - 145 mEq/L   Potassium 4.5 3.5 - 5.1 mEq/L   Chloride 94 (L) 96 - 112 mEq/L   CO2 28 19 - 32 mEq/L   Glucose, Bld 91 70 - 99 mg/dL   BUN 6 6 - 23 mg/dL   Creatinine, Ser 9.10 0.40 - 1.50 mg/dL   Total Bilirubin 0.4 0.2 - 1.2 mg/dL   Alkaline Phosphatase 95 39 - 117 U/L   AST 23 0 - 37 U/L   ALT 21 0 - 53 U/L   Total Protein 7.0 6.0 - 8.3 g/dL   Albumin 5.1 3.5 - 5.2 g/dL   GFR 07.56 >39.99 mL/min   Calcium  9.7 8.4 - 10.5 mg/dL  Hemoglobin J8r  Result Value Ref Range   Hgb A1c MFr Bld 5.9 4.6 - 6.5 %  TSH  Result Value Ref Range   TSH 1.17 0.35 - 5.50 uIU/mL  PSA  Result Value Ref Range   PSA 0.66 0.10 - 4.00 ng/mL  Urinalysis, Routine w reflex microscopic  Result Value Ref Range   Color, Urine YELLOW Yellow;Lt. Yellow;Straw;Dark Yellow;Amber;Green;Red;Brown   APPearance CLEAR Clear;Turbid;Slightly Cloudy;Cloudy   Specific Gravity, Urine 1.010 1.000 - 1.030   pH 6.0 5.0 - 8.0   Total Protein, Urine NEGATIVE Negative   Urine Glucose NEGATIVE Negative   Ketones, ur NEGATIVE Negative   Bilirubin Urine NEGATIVE Negative   Hgb urine dipstick TRACE-INTACT (A) Negative   Urobilinogen, UA 0.2 0.0 - 1.0   Leukocytes,Ua NEGATIVE Negative   Nitrite NEGATIVE Negative   WBC, UA none seen 0-2/hpf   RBC / HPF 0-2/hpf 0-2/hpf  Lipid panel  Result  Value Ref Range   Cholesterol 132 0 - 200 mg/dL   Triglycerides 56.9 0.0 - 149.0 mg/dL   HDL 19.99 >60.99 mg/dL   VLDL 8.6 0.0 - 59.9 mg/dL   LDL Cholesterol 44 0 - 99 mg/dL   Total CHOL/HDL Ratio 2    NonHDL 52.32       The 10-year ASCVD risk score (Arnett DK, et al., 2019) is: 10%    Assessment & Plan:   Healthcare maintenance  Depression with anxiety -     TSH  Alcohol use -     Phosphatidylethanol (PEth); Future  Tobacco use  Coronary artery calcification -     Comprehensive metabolic panel with GFR -     Lipid panel  Benign prostatic hyperplasia with urinary hesitancy -     PSA  Essential hypertension -     CBC with Differential/Platelet -     Comprehensive metabolic panel with GFR -     Urinalysis, Routine w reflex microscopic  Screening for diabetes mellitus -     Comprehensive metabolic panel with GFR -     Hemoglobin A1c  Immunization due -     Tdap vaccine greater than or equal  to 7yo IM  Hyponatremia -     Sodium, urine, random; Future -     Osmolality, urine; Future -     Osmolality; Future -     Cortisol; Future -     Basic metabolic panel with GFR; Future    Return in about 4 weeks (around 12/13/2023).  I sincerely advised patient to stop smoking and drinking.  I believe that the drinking is affecting his response to the sertraline .  I explained this to him.  Asked him to avoid fluids a couple of hours before bedtime.  He was given information about health maintenance and disease prevention.  I asked him to return in 4 weeks for follow-up in order that we could discuss some of the other issues he presented with today: left ankle pain.  Elsie Sim Lent, MD

## 2023-11-17 ENCOUNTER — Other Ambulatory Visit: Payer: Self-pay | Admitting: Family Medicine

## 2023-11-17 DIAGNOSIS — Z720 Tobacco use: Secondary | ICD-10-CM

## 2023-11-19 NOTE — Addendum Note (Signed)
 Addended by: Delene Feinstein on: 11/19/2023 10:13 AM   Modules accepted: Orders

## 2023-11-19 NOTE — Addendum Note (Signed)
 Addended by: Delene Feinstein on: 11/19/2023 10:03 AM   Modules accepted: Orders

## 2024-03-24 ENCOUNTER — Other Ambulatory Visit: Payer: Self-pay | Admitting: Acute Care

## 2024-03-24 DIAGNOSIS — Z122 Encounter for screening for malignant neoplasm of respiratory organs: Secondary | ICD-10-CM

## 2024-03-24 DIAGNOSIS — Z87891 Personal history of nicotine dependence: Secondary | ICD-10-CM

## 2024-04-10 ENCOUNTER — Ambulatory Visit
Admission: RE | Admit: 2024-04-10 | Discharge: 2024-04-10 | Disposition: A | Discharge status: Home / Self Care | Source: Ambulatory Visit | Attending: Family Medicine | Admitting: Family Medicine

## 2024-04-10 DIAGNOSIS — Z87891 Personal history of nicotine dependence: Secondary | ICD-10-CM

## 2024-04-10 DIAGNOSIS — Z122 Encounter for screening for malignant neoplasm of respiratory organs: Secondary | ICD-10-CM

## 2024-04-19 ENCOUNTER — Encounter: Payer: Self-pay | Admitting: Family Medicine

## 2024-04-21 ENCOUNTER — Other Ambulatory Visit: Payer: Self-pay

## 2024-04-21 DIAGNOSIS — F17210 Nicotine dependence, cigarettes, uncomplicated: Secondary | ICD-10-CM

## 2024-04-21 DIAGNOSIS — Z87891 Personal history of nicotine dependence: Secondary | ICD-10-CM

## 2024-04-21 DIAGNOSIS — Z122 Encounter for screening for malignant neoplasm of respiratory organs: Secondary | ICD-10-CM

## 2024-05-29 ENCOUNTER — Other Ambulatory Visit: Payer: Self-pay | Admitting: Family Medicine

## 2024-05-29 DIAGNOSIS — F418 Other specified anxiety disorders: Secondary | ICD-10-CM

## 2024-06-24 ENCOUNTER — Encounter: Payer: Self-pay | Admitting: Cardiovascular Disease
